# Patient Record
Sex: Female | Born: 1975 | Race: White | Hispanic: No | Marital: Single | Smoking: Former smoker
Health system: Southern US, Community
[De-identification: ages and names within clinical notes are randomized; demographics above are authoritative.]

## PROBLEM LIST (undated history)

## (undated) ENCOUNTER — Inpatient Hospital Stay (HOSPITAL_COMMUNITY): Payer: Self-pay

## (undated) DIAGNOSIS — N200 Calculus of kidney: Secondary | ICD-10-CM

## (undated) DIAGNOSIS — F329 Major depressive disorder, single episode, unspecified: Secondary | ICD-10-CM

## (undated) DIAGNOSIS — F32A Depression, unspecified: Secondary | ICD-10-CM

## (undated) DIAGNOSIS — E079 Disorder of thyroid, unspecified: Secondary | ICD-10-CM

## (undated) DIAGNOSIS — F419 Anxiety disorder, unspecified: Secondary | ICD-10-CM

## (undated) HISTORY — DX: Disorder of thyroid, unspecified: E07.9

## (undated) HISTORY — PX: WISDOM TOOTH EXTRACTION: SHX21

## (undated) HISTORY — PX: FEMUR FRACTURE SURGERY: SHX633

---

## 2002-12-05 ENCOUNTER — Inpatient Hospital Stay (HOSPITAL_COMMUNITY): Admission: AD | Admit: 2002-12-05 | Discharge: 2002-12-05 | Payer: Self-pay | Admitting: Obstetrics and Gynecology

## 2002-12-05 ENCOUNTER — Encounter: Payer: Self-pay | Admitting: Obstetrics and Gynecology

## 2002-12-11 ENCOUNTER — Inpatient Hospital Stay (HOSPITAL_COMMUNITY): Admission: RE | Admit: 2002-12-11 | Discharge: 2002-12-11 | Payer: Self-pay | Admitting: Obstetrics and Gynecology

## 2002-12-12 ENCOUNTER — Encounter (INDEPENDENT_AMBULATORY_CARE_PROVIDER_SITE_OTHER): Payer: Self-pay

## 2002-12-12 ENCOUNTER — Ambulatory Visit (HOSPITAL_COMMUNITY): Admission: RE | Admit: 2002-12-12 | Discharge: 2002-12-12 | Payer: Self-pay | Admitting: Obstetrics and Gynecology

## 2003-08-03 ENCOUNTER — Inpatient Hospital Stay (HOSPITAL_COMMUNITY): Admission: AD | Admit: 2003-08-03 | Discharge: 2003-08-03 | Payer: Self-pay | Admitting: Obstetrics

## 2003-08-05 ENCOUNTER — Inpatient Hospital Stay (HOSPITAL_COMMUNITY): Admission: AD | Admit: 2003-08-05 | Discharge: 2003-08-07 | Payer: Self-pay | Admitting: Obstetrics & Gynecology

## 2003-08-29 ENCOUNTER — Ambulatory Visit (HOSPITAL_COMMUNITY): Admission: RE | Admit: 2003-08-29 | Discharge: 2003-08-29 | Payer: Self-pay | Admitting: Obstetrics and Gynecology

## 2003-09-29 ENCOUNTER — Inpatient Hospital Stay (HOSPITAL_COMMUNITY): Admission: AD | Admit: 2003-09-29 | Discharge: 2003-09-29 | Payer: Self-pay | Admitting: Obstetrics and Gynecology

## 2003-10-27 ENCOUNTER — Inpatient Hospital Stay (HOSPITAL_COMMUNITY): Admission: AD | Admit: 2003-10-27 | Discharge: 2003-10-29 | Payer: Self-pay | Admitting: Obstetrics & Gynecology

## 2003-12-20 ENCOUNTER — Other Ambulatory Visit: Admission: RE | Admit: 2003-12-20 | Discharge: 2003-12-20 | Payer: Self-pay | Admitting: Obstetrics and Gynecology

## 2004-02-24 ENCOUNTER — Emergency Department (HOSPITAL_COMMUNITY): Admission: EM | Admit: 2004-02-24 | Discharge: 2004-02-24 | Payer: Self-pay | Admitting: Emergency Medicine

## 2004-08-08 IMAGING — US US ABDOMEN COMPLETE
1 series · 13 of 25 positions shown · non-contrast
Comparison: none

CLINICAL DATA: Reevaluate abdominal pain.  28 weeks estimated gestational age.  
ABDOMINAL ULTRASOUND:
Correlation is made with the previous CT performed on 08/06/03.  
Multiple images of the abdomen were obtained.  
The liver parenchyma appears homogeneous in echotexture without evidence for focal parenchymal abnormality or intrahepatic ductal dilatation.  The gallbladder demonstrates layering intraluminal sludge.  No pericholecystic fluid is seen and the gallbladder shows no evidence for thickness.  The common bile duct measures 2.9 mm in maximal sagittal length and this is within normal limits for size.  
The pancreas was seen in its entirety and is normal in size and echotexture.  
The right kidney demonstrates a maximal sagittal length of 12.8 cm and a small right lower pole renal calculus is identified correlating with the prior CT finding.  The left kidney has a maximal sagittal length of 12.7 cm and also contains a small left upper pole renal calculus which would correlate with the finding on the patient?s recent CT.  No hydronephrosis or ureterectasis is seen.  
The spleen has a normal ultrasound appearance.  The proximal portion of the inferior vena cava and visualized portion of the aorta down to the bifurcation is unremarkable.
IMPRESSION
Bilateral renal calculi correlating with the previous CT appearance.  Layering gallbladder sludge.  Otherwise, normal appearnce.

[Series 1: unknown · 0.32mm/px · 13 of 69 slices shown]
[im 1/69]
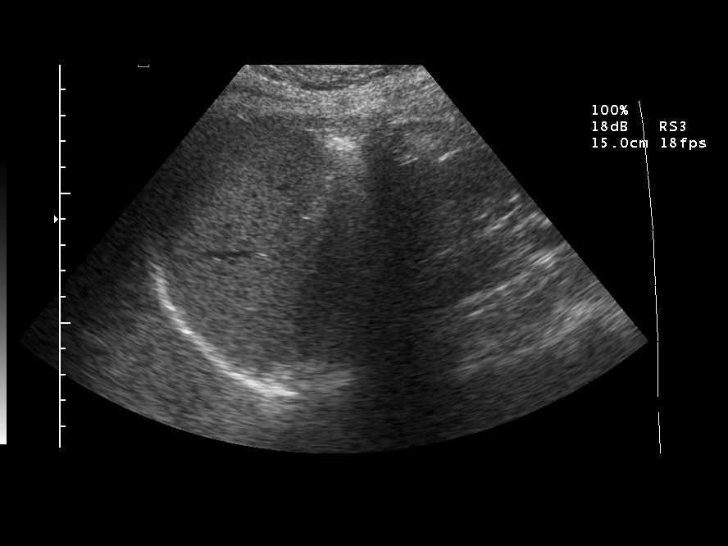
[im 6/69]
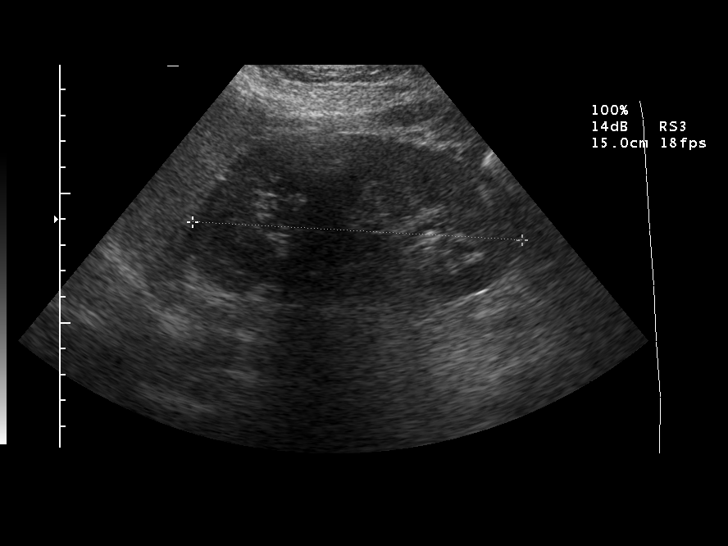
[im 12/69]
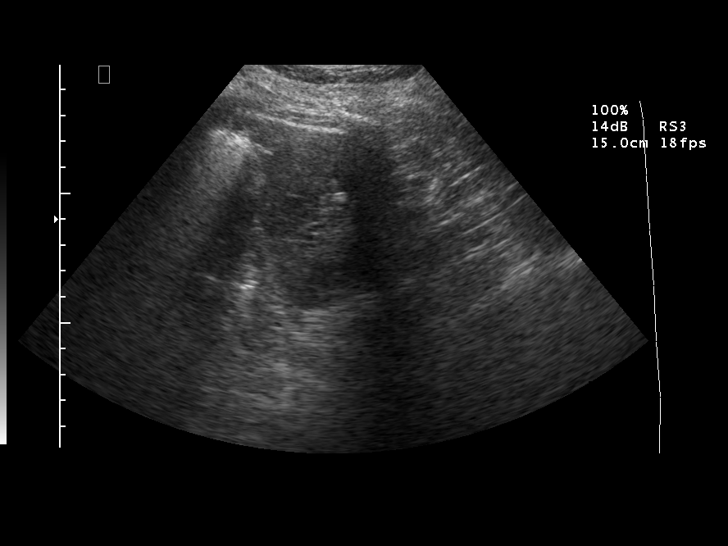
[im 18/69]
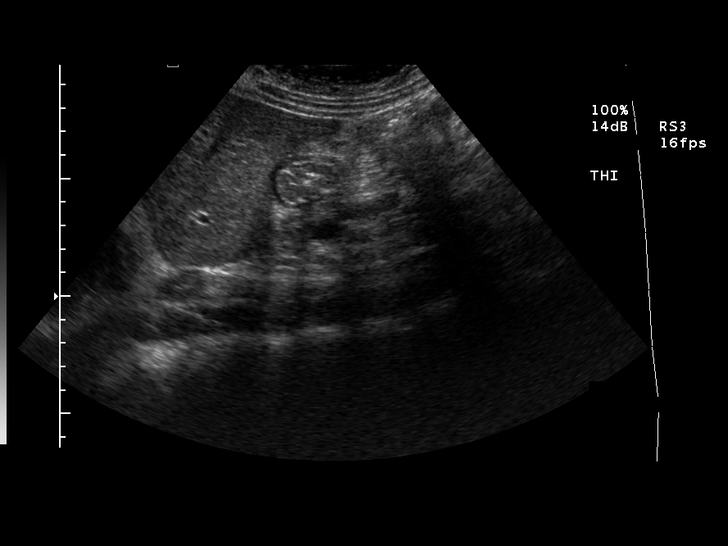
[im 23/69]
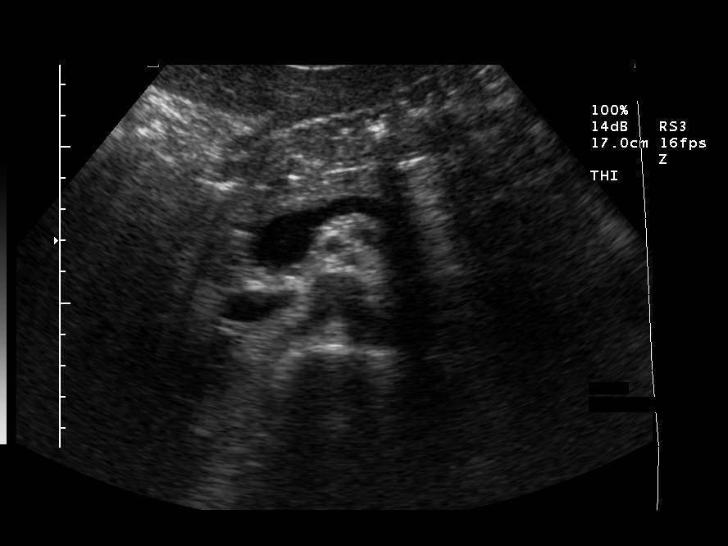
[im 29/69]
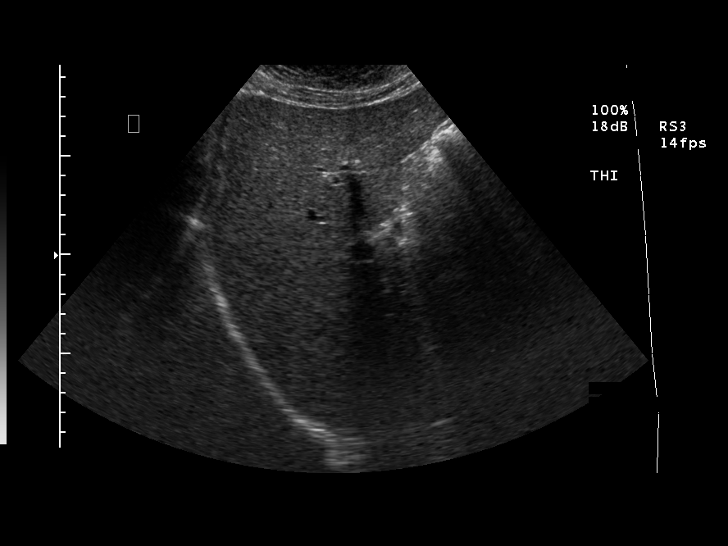
[im 35/69]
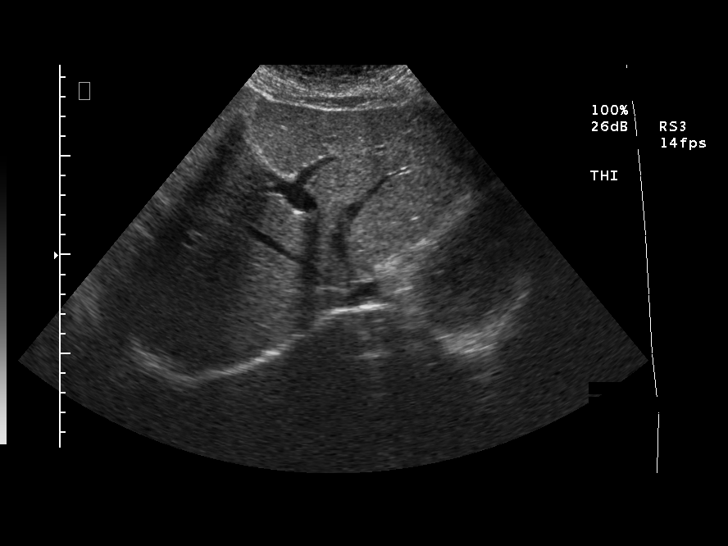
[im 40/69]
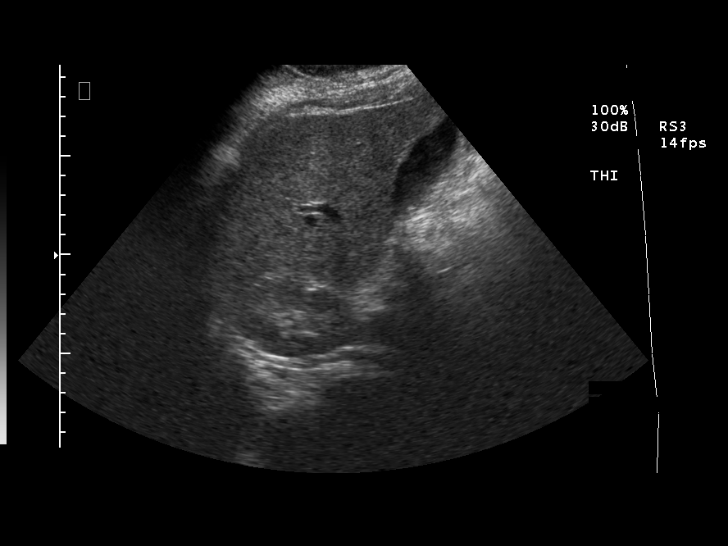
[im 46/69]
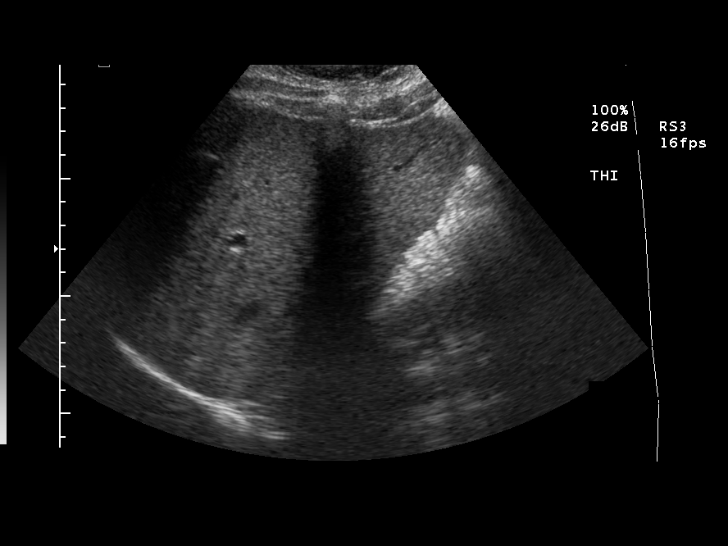
[im 52/69]
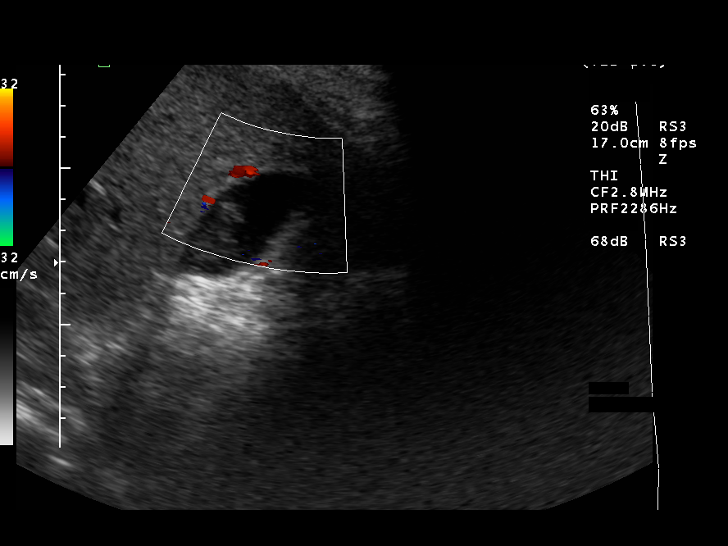
[im 57/69]
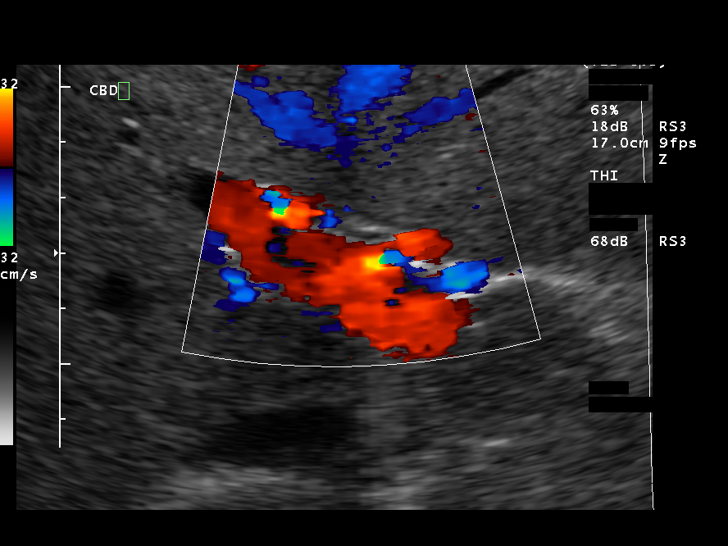
[im 63/69]
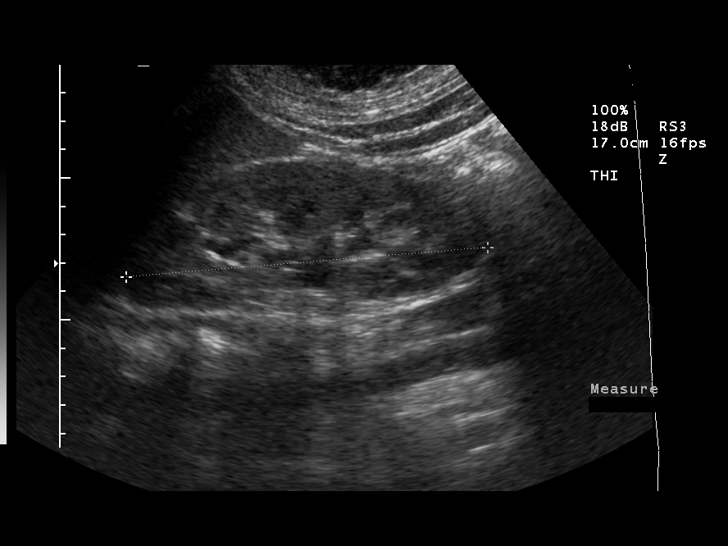
[im 69/69]
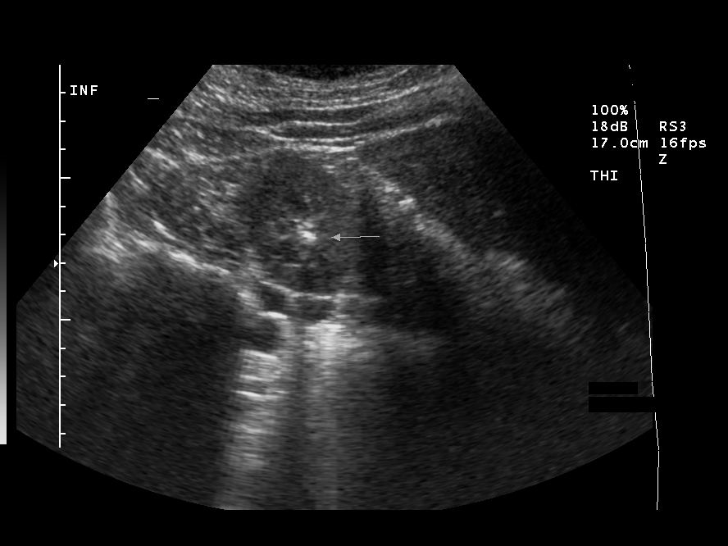

[13 of 25 positions shown; findings below may reference images not displayed]

## 2005-01-02 ENCOUNTER — Inpatient Hospital Stay (HOSPITAL_COMMUNITY): Admission: AC | Admit: 2005-01-02 | Discharge: 2005-01-18 | Payer: Self-pay

## 2005-01-02 ENCOUNTER — Ambulatory Visit: Payer: Self-pay | Admitting: Physical Medicine & Rehabilitation

## 2005-03-23 ENCOUNTER — Encounter: Admission: RE | Admit: 2005-03-23 | Discharge: 2005-04-23 | Payer: Self-pay | Admitting: Orthopedic Surgery

## 2006-10-06 ENCOUNTER — Inpatient Hospital Stay (HOSPITAL_COMMUNITY): Admission: AD | Admit: 2006-10-06 | Discharge: 2006-10-06 | Payer: Self-pay | Admitting: Obstetrics and Gynecology

## 2007-04-06 ENCOUNTER — Emergency Department (HOSPITAL_COMMUNITY): Admission: EM | Admit: 2007-04-06 | Discharge: 2007-04-06 | Payer: Self-pay | Admitting: Family Medicine

## 2007-04-24 ENCOUNTER — Emergency Department (HOSPITAL_COMMUNITY): Admission: EM | Admit: 2007-04-24 | Discharge: 2007-04-24 | Payer: Self-pay | Admitting: Family Medicine

## 2007-06-10 ENCOUNTER — Emergency Department (HOSPITAL_COMMUNITY): Admission: EM | Admit: 2007-06-10 | Discharge: 2007-06-10 | Payer: Self-pay | Admitting: Family Medicine

## 2007-10-16 ENCOUNTER — Ambulatory Visit: Payer: Self-pay | Admitting: Family Medicine

## 2007-11-09 ENCOUNTER — Ambulatory Visit: Payer: Self-pay | Admitting: Internal Medicine

## 2007-11-22 ENCOUNTER — Ambulatory Visit (HOSPITAL_COMMUNITY): Admission: RE | Admit: 2007-11-22 | Discharge: 2007-11-22 | Payer: Self-pay | Admitting: Family Medicine

## 2007-11-24 ENCOUNTER — Ambulatory Visit: Payer: Self-pay | Admitting: Internal Medicine

## 2008-03-23 ENCOUNTER — Emergency Department (HOSPITAL_COMMUNITY): Admission: EM | Admit: 2008-03-23 | Discharge: 2008-03-23 | Payer: Self-pay | Admitting: Emergency Medicine

## 2010-09-11 NOTE — Discharge Summary (Signed)
NAME:  Tammy Green, Tammy Green                         ACCOUNT NO.:  [id number]   MEDICAL RECORD NO.:  [id number]                   PATIENT TYPE:  INP   LOCATION:  9125                                 FACILITY:  [facility]   PHYSICIAN:  Zenaida Niece, M.D.             DATE OF BIRTH:  [birth year]   DATE OF ADMISSION:  10/27/2003  DATE OF DISCHARGE:                                 DISCHARGE SUMMARY   ADMISSION DIAGNOSES:  1. Intrauterine pregnancy at 38 weeks.  2. Anxiety.   DISCHARGE DIAGNOSES:  1. Intrauterine pregnancy at 38 weeks.  2. Anxiety.   PROCEDURE:  On October 27, 2003 she had a precipitous vaginal delivery.   HISTORY AND PHYSICAL:  This is a 35 year old white female gravida 5 para 1-0-  3-1 with an EGA of 38+ weeks by an LMP consistent with an 18-week ultrasound  with a due date of July 13 who presented with a complaint of regular  contractions with some spotting, no rupture of membranes, and good fetal  movement.  Initial evaluation in triage revealed her to have irregular  contractions and no change in her cervix from the exam in the office.  As  she was getting up to go home she had spontaneous rupture of membranes at  1310 with clear fluid.  Prenatal labs:  Blood type A positive with a  negative antibody screen, RPR nonreactive, rubella immune, hepatitis B  surface antigen negative, HIV negative, triple screen is normal, 1-hour  Glucola 138, group B strep negative.  Prenatal care:  Abdominal pain in  April with a negative workup and a follow-up ultrasound also with no  pathology and her pain did gradually resolve.  She had anxiety and was  started on Celexa at 28 weeks.  OB history:  One elective termination and  two spontaneous abortions and in 2002 a vaginal delivery at 38 weeks, 7  pounds 14 ounces, without complications.  Past medical history:  Anxiety.  Allergies:  AXID.  Medications:  Celexa 20 mg p.o. daily.  Physical exam:  She was afebrile with stable vital signs.  Fetal  heart tracing was reactive  with regular contractions.  Abdomen was gravid, nontender, with a fundal  height of 36 cm.  Cervix per the nurse on admission was 3 cm dilated.   HOSPITAL COURSE:  The patient was admitted and initially walked and then  received IV pain medicine.  She then received an epidural.  The patient  apparently was reluctant to have exams.  Finally, at approximately 1905 the  nurse was able to check her and she was 7 cm dilated.  I was called to come  for delivery at that time.  I was actually at that time getting in my car to  come in anyway.  The patient then began to push uncontrollably, progressed  rapidly to complete, and had a precipitous vaginal delivery.  This was a  viable female  infant with Apgars of 8 and 9 that weighed 6 pounds 1 ounce.  Placenta delivered spontaneous and was intact.  She had a small second  degree laceration repaired with 3-0 Vicryl with a local block.  Estimated  blood loss was approximately 500 mL.  Postpartum the patient had no  significant complications.  She bottle fed her baby without complications  and on the morning of postpartum day #2 was stable for discharge home.   DISCHARGE INSTRUCTIONS:  1. Regular diet, pelvic rest, follow up in 6 weeks.  2. Medications are Percocet #20 one to two p.o. q.4-6h. p.r.n. pain and over-     the-counter ibuprofen as needed.  3. She is given our discharge pamphlet.                                               Zenaida Niece, M.D.    TDM/MEDQ  D:  10/29/2003  T:  10/29/2003  Job:  [id number]
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
## 2010-09-11 NOTE — Op Note (Signed)
   Tammy Green, Tammy Green                         ACCOUNT NO.:  [id number]   MEDICAL RECORD NO.:  [id number]                   PATIENT TYPE:  AMB   LOCATION:  SDC                                  FACILITY:  [facility]   PHYSICIAN:  Phil D. Okey Dupre, M.D.                  DATE OF BIRTH:  12/21/1975   DATE OF PROCEDURE:  12/12/2002  DATE OF DISCHARGE:                                 OPERATIVE REPORT   PREOPERATIVE DIAGNOSIS:  Missed abortion.   POSTOPERATIVE DIAGNOSIS:  Missed abortion.   PROCEDURE:  Dilatation and evacuation.   SURGEON:  Javier Glazier. Okey Dupre, M.D.   ANESTHESIA:  MAC plus local.   ESTIMATED BLOOD LOSS:  Less than 5 mL.   OPERATIVE FINDINGS:  Uterus slightly enlarged and retroversion first degree  with a cervix that easily admitted a fingertip.   DESCRIPTION OF PROCEDURE:  A weighted speculum was placed in the posterior  fourchette of the vagina, the anterior lip of the cervix grasped with a  single-tooth tenaculum.  BUS was within normal limits.  Vagina was clean and  well-rugated.  The external genitalia was normal and the cervix was already  dilated to a size of a #10 Hegar dilator.  A #10 suction curette was  inserted to the fundus of the uterus and the uterine contents were evacuated  without incident.  There was no bleeding noted after the procedure from  either the cervix or the area where the tenaculum mark was.  The tenaculum  and speculum having been removed from the vagina, the patient was  transferred to the recovery room in satisfactory condition.  The uterine  curettings were sent for pathologic diagnosis as products of conception.                                               Phil D. Okey Dupre, M.D.    PDR/MEDQ  D:  12/12/2002  T:  12/13/2002  Job:  [id number]

## 2010-09-11 NOTE — Op Note (Signed)
NAMEFREDNA, STRICKER               ACCOUNT NO.:  [id number]   MEDICAL RECORD NO.:  [id number]          PATIENT TYPE:  INP   LOCATION:  2313                         FACILITY:  [facility]   PHYSICIAN:  Elana Alm. Thurston Hole, M.D. DATE OF BIRTH:  [birth year]   DATE OF PROCEDURE:  01/03/2005  DATE OF DISCHARGE:                                 OPERATIVE REPORT   PREOPERATIVE DIAGNOSIS:  Right femoral shaft fracture.   POSTOPERATIVE DIAGNOSIS:  Right femoral shaft fracture.   PROCEDURE:  Closed reduction and intramedullary rodding, right femoral shaft  fracture; using cement Nephew retrograde rod, with a 10 x 34 size -- with 2  distal interlocking screws and 1 proximal interlocking screw.   SURGEON:  Elana Alm. Thurston Hole, M.D.   ASSISTANT:  Julien Girt, P.A.   ANESTHESIA:  General.   OPERATIVE TIME:  1 hour.   ESTIMATED BLOOD LOSS:  50 cc.   COMPLICATIONS:  None.   DESCRIPTION OF PROCEDURE:  Ms. Springer was brought to the operating room on  January 03, 2005; placed under general anesthesia on her own bed and then  transferred to the operative table. She had a Foley catheter placed under  sterile conditions. She received Ancef 1 g IV preoperatively for  prophylaxis. The right leg was then prepped using sterile DuraPrep and  draped using sterile technique. Initial exposure for the placement of the  rod was done through a 4 cm patellar tendon incision. The underlying  subcutaneous tissues were incised in line with the skin incision. The  patellar tendon was split longitudinally, and then using a guide pin and  fluoroscopic control, a guide pin was placed just anterior to the  intercondylar notch, and driven up into the distal femoral shaft under AP  and lateral fluoroscopic guidance.  Then overdrilled with a 12.5 mm drill.  After this was done and a guide pin was placed through this hole, up across  the fracture and into the proximal femoral shaft under fluoroscopic control,  it was  measured for length and found to be 34 mm in length. It was then  overdrilled to 11 mm. After this was done then the 10 mm x 34 mm rod was  placed over the guidewire and hammered into position, holding the fracture  in a reduced and anatomic position. Two distal interlocking screws were then  placed, using fluoroscopic guidance and the distal aiming device.  One of  these was 80 mm, one was 70 through small lateral incisions. This was  confirmed with fluoroscopy. The proximal interlocking screw was then placed  through a 3 cm anterior longitudinal incision under fluoroscopic control,  and a proximal 30 mm screw was placed. This completely locked rotationally  the fracture in a reduced and anatomic position. AP and lateral fluoroscopic  x-rays were obtained, and confirmed satisfactory position of the fracture  and satisfactory position of the hardware. The wounds were then irrigated.  The patellar tendon incision was closed with 2-0 Vicryl; subcutaneous  tissues closed with 2-0 Vicryl, skin closed with 4-  0 Monocryl, as well as the proximal interlocking screw incision. Sterile  dressings were applied.  Neurovascular status checked; pulses 2+ and  symmetric. The patient then awakened, extubated and taken to the recovery  room in stable condition. Needle and sponge count was correct x2 at the end  of the case.      Robert A. Thurston Hole, M.D.  Electronically Signed     RAW/MEDQ  D:  01/03/2005  T:  01/03/2005  Job:  [id number]

## 2011-01-03 ENCOUNTER — Emergency Department (HOSPITAL_COMMUNITY): Payer: Self-pay

## 2011-01-03 ENCOUNTER — Emergency Department (HOSPITAL_COMMUNITY)
Admission: EM | Admit: 2011-01-03 | Discharge: 2011-01-03 | Disposition: A | Discharge status: Home / Self Care | Payer: Self-pay | Attending: Emergency Medicine | Admitting: Emergency Medicine

## 2011-01-03 DIAGNOSIS — K047 Periapical abscess without sinus: Secondary | ICD-10-CM | POA: Insufficient documentation

## 2011-01-03 DIAGNOSIS — K029 Dental caries, unspecified: Secondary | ICD-10-CM | POA: Insufficient documentation

## 2011-01-03 DIAGNOSIS — K089 Disorder of teeth and supporting structures, unspecified: Secondary | ICD-10-CM | POA: Insufficient documentation

## 2011-01-03 MED ORDER — IOHEXOL 300 MG/ML  SOLN
75.0000 mL | Freq: Once | INTRAMUSCULAR | Status: AC | PRN
  Administered 2011-01-03: 75 mL via INTRAVENOUS

## 2011-02-11 LAB — URINALYSIS, ROUTINE W REFLEX MICROSCOPIC
Glucose, UA: NEGATIVE
Nitrite: NEGATIVE
pH: 6

## 2011-02-11 LAB — POCT PREGNANCY, URINE
Operator id: 235481
Preg Test, Ur: NEGATIVE

## 2011-02-11 LAB — CBC
Hemoglobin: 15.1 — ABNORMAL HIGH
RBC: 4.94
RDW: 12.6

## 2011-02-11 LAB — URINE MICROSCOPIC-ADD ON

## 2011-04-27 DIAGNOSIS — N200 Calculus of kidney: Secondary | ICD-10-CM

## 2011-04-27 HISTORY — DX: Calculus of kidney: N20.0

## 2011-11-02 ENCOUNTER — Inpatient Hospital Stay (HOSPITAL_COMMUNITY)
Admission: AD | Admit: 2011-11-02 | Discharge: 2011-11-02 | Disposition: A | Discharge status: Home / Self Care | Payer: Medicaid Other | Source: Ambulatory Visit | Attending: Obstetrics & Gynecology | Admitting: Obstetrics & Gynecology

## 2011-11-02 ENCOUNTER — Inpatient Hospital Stay (HOSPITAL_COMMUNITY): Payer: Medicaid Other

## 2011-11-02 ENCOUNTER — Encounter (HOSPITAL_COMMUNITY): Payer: Self-pay | Admitting: *Deleted

## 2011-11-02 DIAGNOSIS — O209 Hemorrhage in early pregnancy, unspecified: Secondary | ICD-10-CM

## 2011-11-02 DIAGNOSIS — A499 Bacterial infection, unspecified: Secondary | ICD-10-CM | POA: Insufficient documentation

## 2011-11-02 DIAGNOSIS — O98819 Other maternal infectious and parasitic diseases complicating pregnancy, unspecified trimester: Secondary | ICD-10-CM | POA: Insufficient documentation

## 2011-11-02 DIAGNOSIS — O210 Mild hyperemesis gravidarum: Secondary | ICD-10-CM | POA: Insufficient documentation

## 2011-11-02 DIAGNOSIS — R109 Unspecified abdominal pain: Secondary | ICD-10-CM | POA: Insufficient documentation

## 2011-11-02 DIAGNOSIS — A5901 Trichomonal vulvovaginitis: Secondary | ICD-10-CM | POA: Insufficient documentation

## 2011-11-02 DIAGNOSIS — N760 Acute vaginitis: Secondary | ICD-10-CM | POA: Insufficient documentation

## 2011-11-02 DIAGNOSIS — B9689 Other specified bacterial agents as the cause of diseases classified elsewhere: Secondary | ICD-10-CM | POA: Insufficient documentation

## 2011-11-02 DIAGNOSIS — A599 Trichomoniasis, unspecified: Secondary | ICD-10-CM

## 2011-11-02 DIAGNOSIS — O219 Vomiting of pregnancy, unspecified: Secondary | ICD-10-CM

## 2011-11-02 DIAGNOSIS — O2390 Unspecified genitourinary tract infection in pregnancy, unspecified trimester: Secondary | ICD-10-CM | POA: Insufficient documentation

## 2011-11-02 HISTORY — DX: Anxiety disorder, unspecified: F41.9

## 2011-11-02 HISTORY — DX: Depression, unspecified: F32.A

## 2011-11-02 HISTORY — DX: Major depressive disorder, single episode, unspecified: F32.9

## 2011-11-02 LAB — CBC
HCT: 41.9 % (ref 36.0–46.0)
MCH: 30.8 pg (ref 26.0–34.0)
MCHC: 33.4 g/dL (ref 30.0–36.0)
MCV: 92.3 fL (ref 78.0–100.0)
WBC: 11.7 10*3/uL — ABNORMAL HIGH (ref 4.0–10.5)

## 2011-11-02 LAB — URINE MICROSCOPIC-ADD ON

## 2011-11-02 LAB — URINALYSIS, ROUTINE W REFLEX MICROSCOPIC
Ketones, ur: NEGATIVE mg/dL
pH: 8 (ref 5.0–8.0)

## 2011-11-02 LAB — HCG, QUANTITATIVE, PREGNANCY: hCG, Beta Chain, Quant, S: 53820 m[IU]/mL — ABNORMAL HIGH (ref <5)

## 2011-11-02 LAB — WET PREP, GENITAL: Yeast Wet Prep HPF POC: NONE SEEN

## 2011-11-02 LAB — POCT PREGNANCY, URINE: Preg Test, Ur: POSITIVE — AB

## 2011-11-02 MED ORDER — METRONIDAZOLE 500 MG PO TABS: 500.0000 mg | ORAL_TABLET | Freq: Two times a day (BID) | ORAL | Status: AC

## 2011-11-02 MED ORDER — PROMETHAZINE HCL 12.5 MG PO TABS: 12.5000 mg | ORAL_TABLET | Freq: Four times a day (QID) | ORAL | Status: DC | PRN

## 2011-11-02 NOTE — MAU Provider Note (Signed)
History  CSN: [id number] Arrival date and time: 11/02/11 1209  First Provider Initiated Contact with Patient 11/02/11 1259    Chief Complaint  Patient presents with  . Possible Pregnancy  . Abdominal Cramping   HPI Patient is a tearful 36 yo woman, [id number], @ unknown with LMP approximately last week of May with PMH of 7 miscarriages and 2 elective abortions. Patient presents with vaginal discharge, nausea, and abdominal tenderness. Patient reports that 2 weeks ago she had a positive pregnancy test and then approx. 1 week ago (10/27/11) she began having red spotting that progressed to red bleeding into the toilet mixed with white tissue-like substances. This continued for approximately 24 hours and began to change over to pink discharge by 10/28/11 which continued to saturate her liner which was changed about every 2-3 hours. This morning as the patient awoke she noticed the discharge had changed to yellow and she was worried it might be a sign of infection. Patient has also had nausea since Sunday and has not been able to eat any food or keep anything down. She has been dry heaving and decided to eat something last night just so she would be able to vomit something. Patient also reports having nightly chills, cold sweats, abdominal cramping all of which she is used to experiencing when having a miscarriage.   OB History    Grav Para Term Preterm Abortions TAB SAB Ect Mult Living   12 2 2  9 2 7   2       Past Medical History  Diagnosis Date  . Anxiety   . Depression     Past Surgical History  Procedure Date  . Femur fracture surgery   . Wisdom tooth extraction     Family History  Problem Relation Age of Onset  . Bipolar disorder Mother   . Bipolar disorder Sister   . Bipolar disorder Brother   . Diabetes Maternal Grandmother   . Cancer Maternal Grandfather     History  Substance Use Topics  . Smoking status: Current Everyday Smoker -- 0.2 packs/day for 18 years  . Smokeless  tobacco: Not on file  . Alcohol Use: No    Allergies:  Allergies  Allergen Reactions  . Axid (Nizatidine) Hives    Prescriptions prior to admission  Medication Sig Dispense Refill  . naproxen sodium (ANAPROX) 220 MG tablet Take 440 mg by mouth 2 (two) times daily as needed. For pain        Review of Systems  Constitutional: Positive for chills (qhs), malaise/fatigue and diaphoresis ( cold sweats). Negative for fever.  HENT: Negative.   Eyes: Negative.   Respiratory: Negative.   Cardiovascular: Negative.   Gastrointestinal: Positive for nausea, vomiting and abdominal pain ( LLQ / LRQ (discomfort)).  Genitourinary: Negative.   Musculoskeletal: Negative.   Skin: Negative.   Neurological: Negative.   Endo/Heme/Allergies: Negative.   Psychiatric/Behavioral: Positive for depression. Negative for suicidal ideas and substance abuse. The patient is nervous/anxious.    Physical Exam   Blood pressure 107/59, pulse 67, temperature 98.5 F (36.9 C), temperature source Oral, resp. rate 16, height 5' 6.5" (1.689 m), weight 83.099 kg (183 lb 3.2 oz), last menstrual period 09/20/2011, SpO2 100.00%.  Physical Exam  Constitutional: She is oriented to person, place, and time. She appears well-developed and well-nourished.       Upset, tearful, sad  HENT:  Head: Normocephalic and atraumatic.  Eyes: Conjunctivae are normal.  Neck: Normal range of motion. Neck supple.  Cardiovascular:  Normal rate, regular rhythm and normal heart sounds.   Respiratory: Effort normal and breath sounds normal.  GI: Soft. There is tenderness ( LLQ / RLQ).  Genitourinary: Uterus is enlarged (mildly enlarged) and tender (mild). Cervix exhibits motion tenderness. Cervix exhibits no discharge and no friability. Right adnexum displays no mass, no tenderness and no fullness. Left adnexum displays tenderness. Left adnexum displays no mass and no fullness. No bleeding around the vagina. Vaginal discharge (light yellowish  vaginal discharge with mild odor) found.  Musculoskeletal: She exhibits no edema and no tenderness.  Neurological: She is alert and oriented to person, place, and time.  Skin: Skin is warm and dry.  Psychiatric: She has a normal mood and affect. Her behavior is normal.   Results for orders placed during the hospital encounter of 11/02/11 (from the past 24 hour(s))  URINALYSIS, ROUTINE W REFLEX MICROSCOPIC     Status: Abnormal   Collection Time   11/02/11 12:25 PM      Component Value Range   Color, Urine YELLOW  YELLOW   APPearance CLOUDY (*) CLEAR   Specific Gravity, Urine 1.015  1.005 - 1.030   pH 8.0  5.0 - 8.0   Glucose, UA NEGATIVE  NEGATIVE mg/dL   Hgb urine dipstick NEGATIVE  NEGATIVE   Bilirubin Urine NEGATIVE  NEGATIVE   Ketones, ur NEGATIVE  NEGATIVE mg/dL   Protein, ur NEGATIVE  NEGATIVE mg/dL   Urobilinogen, UA 0.2  0.0 - 1.0 mg/dL   Nitrite NEGATIVE  NEGATIVE   Leukocytes, UA MODERATE (*) NEGATIVE  URINE MICROSCOPIC-ADD ON     Status: Abnormal   Collection Time   11/02/11 12:25 PM      Component Value Range   Squamous Epithelial / LPF MANY (*) RARE   WBC, UA 7-10  <3 WBC/hpf   Bacteria, UA FEW (*) RARE   Urine-Other AMORPHOUS URATES/PHOSPHATES    POCT PREGNANCY, URINE     Status: Abnormal   Collection Time   11/02/11 12:37 PM      Component Value Range   Preg Test, Ur POSITIVE (*) NEGATIVE  CBC     Status: Abnormal   Collection Time   11/02/11  1:58 PM      Component Value Range   WBC 11.7 (*) 4.0 - 10.5 K/uL   RBC 4.54  3.87 - 5.11 MIL/uL   Hemoglobin 14.0  12.0 - 15.0 g/dL   HCT 45.4  09.8 - 11.9 %   MCV 92.3  78.0 - 100.0 fL   MCH 30.8  26.0 - 34.0 pg   MCHC 33.4  30.0 - 36.0 g/dL   RDW 14.7  82.9 - 56.2 %   Platelets 254  150 - 400 K/uL  ABO/RH     Status: Normal   Collection Time   11/02/11  1:58 PM      Component Value Range   ABO/RH(D) A POS    HCG, QUANTITATIVE, PREGNANCY     Status: Abnormal   Collection Time   11/02/11  1:58 PM      Component Value  Range   hCG, Beta Chain, Quant, S 53820 (*) <5 mIU/mL  WET PREP, GENITAL     Status: Abnormal   Collection Time   11/02/11  2:00 PM      Component Value Range   Yeast Wet Prep HPF POC NONE SEEN  NONE SEEN   Trich, Wet Prep MODERATE (*) NONE SEEN   Clue Cells Wet Prep HPF POC MANY (*) NONE SEEN  WBC, Wet Prep HPF POC TOO NUMEROUS TO COUNT (*) NONE SEEN      MAU Course  Procedures - [place] to determine state of pregnancy, wet prep to r/o infection, GC/CHL to lab   MDM - US pelvis to determine IUP vs. ectopic vs. Miscarriage - Wet prep and GC/CHL to r/o infection given extreme vaginal introitus tenderness  I reviewed the student's H&P, observed his exam and reviewed the note and plan of care and agree with his findings.  Assessment and Plan  1) Viable IUP @ [redacted]w[redacted]d gestation 2) Trich and BV - flagyl rx for 7 days 3) N & V of pregnancy - will give rx phenergan 4) Begin prenatal care with Dr. Jackelyn Knife (who delivered her last baby)  Lewie Chamber 11/02/2011, 1:19 PM

## 2011-11-02 NOTE — MAU Provider Note (Signed)
Medical Screening exam and patient care preformed by advanced practice provider.  Agree with the above management.  

## 2011-11-02 NOTE — MAU Note (Signed)
Patient states she had 2 positive home pregnancy tests about 2 weeks ago. Had some spotting last week and has been having abdominal cramping with a yellow discharge with an odor. Cold sweats, nausea with one episode of vomiting.

## 2011-11-02 NOTE — MAU Note (Signed)
Had 2 +hpt, has had cramping and spotting with discharge for the past week with an odor that she says is not normal for her.

## 2011-11-03 LAB — GC/CHLAMYDIA PROBE AMP, GENITAL: GC Probe Amp, Genital: NEGATIVE

## 2011-11-19 ENCOUNTER — Encounter (HOSPITAL_COMMUNITY): Payer: Self-pay | Admitting: *Deleted

## 2011-11-21 ENCOUNTER — Inpatient Hospital Stay (HOSPITAL_COMMUNITY)
Admission: AD | Admit: 2011-11-21 | Discharge: 2011-11-21 | Disposition: A | Discharge status: Home / Self Care | Payer: Medicaid Other | Source: Ambulatory Visit | Attending: Obstetrics and Gynecology | Admitting: Obstetrics and Gynecology

## 2011-11-21 ENCOUNTER — Encounter (HOSPITAL_COMMUNITY): Payer: Self-pay

## 2011-11-21 DIAGNOSIS — O209 Hemorrhage in early pregnancy, unspecified: Secondary | ICD-10-CM | POA: Insufficient documentation

## 2011-11-21 DIAGNOSIS — O200 Threatened abortion: Secondary | ICD-10-CM

## 2011-11-21 NOTE — MAU Note (Signed)
Got home from work tonight and found brown blood in pants. Since then sees pink spotting on toilet paper when she wipes. Denies abdominal cramping or pain.

## 2011-11-21 NOTE — MAU Provider Note (Signed)
Agree with above note.  Tammy Green 11/21/2011 7:50 AM

## 2011-11-21 NOTE — MAU Provider Note (Signed)
  History     CSN: [id number]  Arrival date and time: 11/21/11 0158   None     Chief Complaint  Patient presents with  . Vaginal Bleeding   HPI Tammy Green is a 36 y.o. female @ [redacted]w[redacted]d gestation who presents to MAU for spotting. Patient states it is less than when she was here 11/02/11, but because she has had so many miscarriages every time she sees spotting she is afraid she is going to miscarry. She denies any other problems tonight. The history was provided by the patient.  OB History    Grav Para Term Preterm Abortions TAB SAB Ect Mult Living   12 2 2  9 2 7   2       Past Medical History  Diagnosis Date  . Anxiety   . Depression     Past Surgical History  Procedure Date  . Femur fracture surgery   . Wisdom tooth extraction     Family History  Problem Relation Age of Onset  . Bipolar disorder Mother   . Bipolar disorder Sister   . Bipolar disorder Brother   . Diabetes Maternal Grandmother   . Cancer Maternal Grandfather     History  Substance Use Topics  . Smoking status: Former Smoker -- 0.2 packs/day for 18 years  . Smokeless tobacco: Former Neurosurgeon    Quit date: 11/15/2011  . Alcohol Use: No    Allergies:  Allergies  Allergen Reactions  . Axid (Nizatidine) Hives    Prescriptions prior to admission  Medication Sig Dispense Refill  . metroNIDAZOLE (FLAGYL) 500 MG tablet Take 500 mg by mouth 2 (two) times daily.      . promethazine (PHENERGAN) 12.5 MG tablet Take 1 tablet (12.5 mg total) by mouth every 6 (six) hours as needed for nausea.  30 tablet  0    ROS: as stated in HPI Blood pressure 108/64, pulse 83, temperature 98.1 F (36.7 C), temperature source Oral, resp. rate 16, height 5' 6.5" (1.689 m), weight 185 lb 9.6 oz (84.188 kg), last menstrual period 09/20/2011.  Physical Exam  Nursing note and vitals reviewed. Constitutional: She is oriented to person, place, and time. She appears well-developed and well-nourished. No distress.  HENT:    Head: Normocephalic and atraumatic.  Eyes: EOM are normal.  Neck: Neck supple.  Cardiovascular: Normal rate.   Respiratory: Effort normal.  GI: Soft. There is no tenderness.  Genitourinary:       Scant dark blood vaginal vault. Cervix closed.  Musculoskeletal: Normal range of motion.  Neurological: She is alert and oriented to person, place, and time.  Skin: Skin is warm and dry.  Psychiatric: Her behavior is normal. Judgment and thought content normal. Her mood appears anxious.    MAU Course  Procedures Informal bedside ultrasound shows IUP with cardiac activity. Patient able to visualize heart beat.  Assessment and Plan  37 y.o. female @ [redacted]w[redacted]d gestation with vaginal bleeding  Discussed with the patient threatened AB and plan of care. Patient voices understanding Start prenatal care Return here as needed  NEESE,HOPE, RN, FNP, [facility] 11/21/2011, 2:57 AM

## 2012-02-05 ENCOUNTER — Encounter (HOSPITAL_COMMUNITY): Payer: Self-pay

## 2012-02-05 ENCOUNTER — Inpatient Hospital Stay (HOSPITAL_COMMUNITY)
Admission: AD | Admit: 2012-02-05 | Discharge: 2012-02-05 | Disposition: A | Discharge status: Home / Self Care | Payer: Medicaid Other | Source: Ambulatory Visit | Attending: Obstetrics and Gynecology | Admitting: Obstetrics and Gynecology

## 2012-02-05 DIAGNOSIS — R109 Unspecified abdominal pain: Secondary | ICD-10-CM | POA: Insufficient documentation

## 2012-02-05 DIAGNOSIS — O99891 Other specified diseases and conditions complicating pregnancy: Secondary | ICD-10-CM | POA: Insufficient documentation

## 2012-02-05 DIAGNOSIS — N949 Unspecified condition associated with female genital organs and menstrual cycle: Secondary | ICD-10-CM

## 2012-02-05 MED ORDER — CYCLOBENZAPRINE HCL 10 MG PO TABS: 10.0000 mg | ORAL_TABLET | Freq: Three times a day (TID) | ORAL | Status: DC | PRN

## 2012-02-05 NOTE — MAU Provider Note (Signed)
Chief Complaint: Abdominal Cramping  First Provider Initiated Contact with Patient 02/05/12 0221      SUBJECTIVE HPI: Tammy Green is a 36 y.o. [id number] at [redacted]w[redacted]d by LMP who presents with Patient states she has had cramping for several days worse today because has been lifting heavy things, cleaning house because of bed bugs, carrying son because he fractured his ankle. Pain is sharp, intermittent, worse w/ mvmt. Denies VB, LOF, urinary Sx, GI Sx.    Past Medical History  Diagnosis Date  . Anxiety   . Depression    OB History    Grav Para Term Preterm Abortions TAB SAB Ect Mult Living   12 2 2  9 2 7   2      # Outc Date GA Lbr Len/2nd Wgt Sex Del Anes PTL Lv   1 TRM      SVD   Yes   2 TRM      SVD   Yes   3 TAB            4 TAB            5 SAB            6 SAB            7 SAB            8 SAB            9 SAB            10 SAB            11 SAB            12 CUR              Past Surgical History  Procedure Date  . Femur fracture surgery   . Wisdom tooth extraction    History   Social History  . Marital Status: Single    Spouse Name: N/A    Number of Children: N/A  . Years of Education: N/A   Occupational History  . Not on file.   Social History Main Topics  . Smoking status: Former Smoker -- 0.2 packs/day for 18 years  . Smokeless tobacco: Former Neurosurgeon    Quit date: 11/15/2011  . Alcohol Use: No  . Drug Use: No  . Sexually Active: Not Currently     not since she conceived   Other Topics Concern  . Not on file   Social History Narrative  . No narrative on file   No current facility-administered medications on file prior to encounter.   Current Outpatient Prescriptions on File Prior to Encounter  Medication Sig Dispense Refill  . metroNIDAZOLE (FLAGYL) 500 MG tablet Take 500 mg by mouth 2 (two) times daily.      . promethazine (PHENERGAN) 12.5 MG tablet Take 1 tablet (12.5 mg total) by mouth every 6 (six) hours as needed for nausea.  30 tablet  0     Allergies  Allergen Reactions  . Axid (Nizatidine) Hives    ROS: Pertinent items in HPI  OBJECTIVE Blood pressure 101/54, pulse 76, temperature 97.5 F (36.4 C), temperature source Oral, resp. rate 18, height 5\' 7"  (1.702 m), weight 83.008 kg (183 lb), last menstrual period 09/20/2011. GENERAL: Well-developed, well-nourished female in no acute distress. Anxious.  HEENT: Normocephalic HEART: normal rate RESP: normal effort ABDOMEN: Soft, non-tender, Fundus @U  EXTREMITIES: Nontender, no edema NEURO: Alert and oriented SPECULUM EXAM: Deferred BIMANUAL: cervix long and closed;  no blood or LOF. FHR 154 by doppler  LAB RESULTS No results found for this or any previous visit (from the past 24 hour(s)).  IMAGING No results found.  MAU COURSE After exam pt expressed anxiety about findings on [place]. Pt not sure of terminology, but state dthe the baby has a "black spot " on its heart and the placenta is over the cervix. Asked pt if she has been to to be on pelvic rest. She denies that she was told to do so. Discussed that I do not have the [place] report available for review, but recommended no IC of pelvic exams until location of placenta verified with office. Pt verbalizes understanding. No bleeding after gentle exam.   ASSESSMENT 1. Round ligament pain    PLAN Discharge home Comfort measures Pelvic rest Bleeding precautions     Follow-up Information    Follow up with Bing Plume, MD. On 02/08/2012. (or MAU as needed if symptoms worsen)    Contact information:   [employer] [address], [address] [place] [address] [phone]           Medication List     As of 02/05/2012  3:32 AM    TAKE these medications         cyclobenzaprine 10 MG tablet   Commonly known as: FLEXERIL   Take 1 tablet (10 mg total) by mouth 3 (three) times daily as needed for muscle spasms.      metroNIDAZOLE 500 MG tablet   Commonly known as: FLAGYL    Take 500 mg by mouth 2 (two) times daily.      promethazine 12.5 MG tablet   Commonly known as: PHENERGAN   Take 1 tablet (12.5 mg total) by mouth every 6 (six) hours as needed for nausea.        [place], CNM 02/05/2012  3:32 AM

## 2012-02-05 NOTE — MAU Note (Signed)
Patient states she has had cramping for several days worse today because has been lifting heavy things, house had bed bugs, son fractured his ankle so she has been carrying him around.

## 2012-02-08 LAB — OB RESULTS CONSOLE ANTIBODY SCREEN: Antibody Screen: NEGATIVE

## 2012-02-08 LAB — OB RESULTS CONSOLE RUBELLA ANTIBODY, IGM: Rubella: IMMUNE

## 2012-02-08 LAB — OB RESULTS CONSOLE HEPATITIS B SURFACE ANTIGEN: Hepatitis B Surface Ag: NEGATIVE

## 2012-02-08 LAB — OB RESULTS CONSOLE RPR: RPR: NONREACTIVE

## 2012-03-27 ENCOUNTER — Observation Stay (HOSPITAL_COMMUNITY)
Admission: AD | Admit: 2012-03-27 | Discharge: 2012-03-30 | Disposition: A | Discharge status: Home / Self Care | Payer: Medicaid Other | Source: Ambulatory Visit | Attending: Obstetrics and Gynecology | Admitting: Obstetrics and Gynecology

## 2012-03-27 ENCOUNTER — Inpatient Hospital Stay (HOSPITAL_COMMUNITY): Payer: Medicaid Other

## 2012-03-27 ENCOUNTER — Encounter (HOSPITAL_COMMUNITY): Payer: Self-pay | Admitting: Family

## 2012-03-27 DIAGNOSIS — R109 Unspecified abdominal pain: Secondary | ICD-10-CM | POA: Insufficient documentation

## 2012-03-27 DIAGNOSIS — N1330 Unspecified hydronephrosis: Secondary | ICD-10-CM | POA: Insufficient documentation

## 2012-03-27 DIAGNOSIS — O26839 Pregnancy related renal disease, unspecified trimester: Principal | ICD-10-CM | POA: Insufficient documentation

## 2012-03-27 DIAGNOSIS — N200 Calculus of kidney: Secondary | ICD-10-CM | POA: Diagnosis present

## 2012-03-27 DIAGNOSIS — N201 Calculus of ureter: Secondary | ICD-10-CM | POA: Insufficient documentation

## 2012-03-27 LAB — URINALYSIS, ROUTINE W REFLEX MICROSCOPIC
Nitrite: NEGATIVE
Protein, ur: NEGATIVE mg/dL
Specific Gravity, Urine: 1.02 (ref 1.005–1.030)
Urobilinogen, UA: 0.2 mg/dL (ref 0.0–1.0)

## 2012-03-27 LAB — URINE MICROSCOPIC-ADD ON

## 2012-03-27 MED ORDER — HYDROMORPHONE 0.3 MG/ML IV SOLN
INTRAVENOUS | Status: DC
  Administered 2012-03-27: 23:00:00 via INTRAVENOUS
  Administered 2012-03-28: 1.79 mg via INTRAVENOUS
  Administered 2012-03-28: 0.999 mg via INTRAVENOUS
  Administered 2012-03-28: 2.19 mg via INTRAVENOUS
  Administered 2012-03-29: 1.79 mg via INTRAVENOUS
  Administered 2012-03-29: 0.679 mg via INTRAVENOUS
  Administered 2012-03-29: 03:00:00 via INTRAVENOUS
  Administered 2012-03-30: 0.799 mg via INTRAVENOUS
  Administered 2012-03-30 (×2): 0.2 mg via INTRAVENOUS
  Filled 2012-03-27 (×4): qty 25

## 2012-03-27 MED ORDER — CALCIUM CARBONATE ANTACID 500 MG PO CHEW: 2.0000 | CHEWABLE_TABLET | ORAL | Status: DC | PRN

## 2012-03-27 MED ORDER — PROMETHAZINE HCL 25 MG/ML IJ SOLN
12.5000 mg | Freq: Four times a day (QID) | INTRAMUSCULAR | Status: DC | PRN
  Administered 2012-03-27: 12.5 mg via INTRAVENOUS
  Filled 2012-03-27: qty 1

## 2012-03-27 MED ORDER — SODIUM CHLORIDE 0.9 % IJ SOLN: 9.0000 mL | INTRAMUSCULAR | Status: DC | PRN

## 2012-03-27 MED ORDER — LACTATED RINGERS IV SOLN
INTRAVENOUS | Status: DC
  Administered 2012-03-27: 20:00:00 via INTRAVENOUS
  Administered 2012-03-27: 250 mL/h via INTRAVENOUS

## 2012-03-27 MED ORDER — DIPHENHYDRAMINE HCL 12.5 MG/5ML PO ELIX
12.5000 mg | ORAL_SOLUTION | Freq: Four times a day (QID) | ORAL | Status: DC | PRN
  Administered 2012-03-28 – 2012-03-29 (×3): 12.5 mg via ORAL
  Filled 2012-03-27 (×3): qty 5

## 2012-03-27 MED ORDER — HYDROMORPHONE 0.3 MG/ML IV SOLN: INTRAVENOUS | Status: DC

## 2012-03-27 MED ORDER — HYDROMORPHONE HCL PF 1 MG/ML IJ SOLN
1.0000 mg | Freq: Once | INTRAMUSCULAR | Status: AC
  Administered 2012-03-27: 1 mg via INTRAVENOUS
  Filled 2012-03-27: qty 1

## 2012-03-27 MED ORDER — DOCUSATE SODIUM 100 MG PO CAPS
100.0000 mg | ORAL_CAPSULE | Freq: Every day | ORAL | Status: DC
  Administered 2012-03-28 – 2012-03-30 (×3): 100 mg via ORAL
  Filled 2012-03-27 (×3): qty 1

## 2012-03-27 MED ORDER — ZOLPIDEM TARTRATE 5 MG PO TABS: 5.0000 mg | ORAL_TABLET | Freq: Every evening | ORAL | Status: DC | PRN

## 2012-03-27 MED ORDER — PRENATAL MULTIVITAMIN CH
1.0000 | ORAL_TABLET | Freq: Every day | ORAL | Status: DC
  Administered 2012-03-28 – 2012-03-29 (×2): 1 via ORAL
  Filled 2012-03-27 (×3): qty 1

## 2012-03-27 MED ORDER — DIPHENHYDRAMINE HCL 50 MG/ML IJ SOLN: 12.5000 mg | Freq: Four times a day (QID) | INTRAMUSCULAR | Status: DC | PRN

## 2012-03-27 MED ORDER — TAMSULOSIN HCL 0.4 MG PO CAPS
0.4000 mg | ORAL_CAPSULE | Freq: Every day | ORAL | Status: DC
  Administered 2012-03-27 – 2012-03-29 (×3): 0.4 mg via ORAL
  Filled 2012-03-27 (×3): qty 1

## 2012-03-27 MED ORDER — ACETAMINOPHEN 325 MG PO TABS: 650.0000 mg | ORAL_TABLET | ORAL | Status: DC | PRN

## 2012-03-27 MED ORDER — LACTATED RINGERS IV SOLN
INTRAVENOUS | Status: DC
  Administered 2012-03-27 – 2012-03-30 (×8): via INTRAVENOUS

## 2012-03-27 MED ORDER — ONDANSETRON HCL 4 MG/2ML IJ SOLN
4.0000 mg | Freq: Four times a day (QID) | INTRAMUSCULAR | Status: DC | PRN
  Filled 2012-03-27: qty 2

## 2012-03-27 MED ORDER — NALOXONE HCL 0.4 MG/ML IJ SOLN: 0.4000 mg | INTRAMUSCULAR | Status: DC | PRN

## 2012-03-27 NOTE — MAU Note (Signed)
Pt reports severe back pain x 1 hour; has hx of kidney stones

## 2012-03-27 NOTE — MAU Provider Note (Signed)
History     CSN: [id number]  Arrival date and time: 03/27/12 1720   None     Chief Complaint  Patient presents with  . Back Pain   HPI This is a 36 y.o. female at [redacted]w[redacted]d who presents with severe R flank pain for an hour. States it feels the same as when she had a kidney stone before. Denies dysuria or fever. Denies contractions. + fetal movement.   RN Note: Pt reports severe back pain x 1 hour; has hx of kidney stones      OB History    Grav Para Term Preterm Abortions TAB SAB Ect Mult Living   12 2 2  9 2 7   2       Past Medical History  Diagnosis Date  . Anxiety   . Depression     Past Surgical History  Procedure Date  . Femur fracture surgery   . Wisdom tooth extraction     Family History  Problem Relation Age of Onset  . Bipolar disorder Mother   . Bipolar disorder Sister   . Bipolar disorder Brother   . Diabetes Maternal Grandmother   . Cancer Maternal Grandfather     History  Substance Use Topics  . Smoking status: Former Smoker -- 0.2 packs/day for 18 years  . Smokeless tobacco: Former Neurosurgeon    Quit date: 11/15/2011  . Alcohol Use: No    Allergies:  Allergies  Allergen Reactions  . Axid (Nizatidine) Hives    Prescriptions prior to admission  Medication Sig Dispense Refill  . Prenatal Vit-Fe Fumarate-FA (PRENATAL MULTIVITAMIN) TABS Take 1 tablet by mouth daily.      . promethazine (PHENERGAN) 12.5 MG tablet Take 1 tablet (12.5 mg total) by mouth every 6 (six) hours as needed for nausea.  30 tablet  0    ROS See HPI  Physical Exam   Blood pressure 117/62, pulse 83, temperature 98.5 F (36.9 C), temperature source Oral, resp. rate 20, last menstrual period 09/20/2011.  Physical Exam  Constitutional: She is oriented to person, place, and time. She appears well-developed and well-nourished. She appears distressed (with pain).  HENT:  Head: Normocephalic.  Cardiovascular: Normal rate.   Respiratory: Effort normal.  GI: Soft. She  exhibits no distension and no mass. There is tenderness (Over Right Flank). There is no rebound and no guarding.  Genitourinary: No vaginal discharge found.  Musculoskeletal: Normal range of motion.  Neurological: She is alert and oriented to person, place, and time.  Skin: Skin is warm and dry.  Psychiatric: She has a normal mood and affect.   Results for orders placed during the hospital encounter of 03/27/12 (from the past 24 hour(s))  URINALYSIS, ROUTINE W REFLEX MICROSCOPIC     Status: Abnormal   Collection Time   03/27/12  5:30 PM      Component Value Range   Color, Urine YELLOW  YELLOW   APPearance CLOUDY (*) CLEAR   Specific Gravity, Urine 1.020  1.005 - 1.030   pH 6.5  5.0 - 8.0   Glucose, UA NEGATIVE  NEGATIVE mg/dL   Hgb urine dipstick LARGE (*) NEGATIVE   Bilirubin Urine NEGATIVE  NEGATIVE   Ketones, ur NEGATIVE  NEGATIVE mg/dL   Protein, ur NEGATIVE  NEGATIVE mg/dL   Urobilinogen, UA 0.2  0.0 - 1.0 mg/dL   Nitrite NEGATIVE  NEGATIVE   Leukocytes, UA TRACE (*) NEGATIVE  URINE MICROSCOPIC-ADD ON     Status: Abnormal   Collection Time  03/27/12  5:30 PM      Component Value Range   Squamous Epithelial / LPF MANY (*) RARE   WBC, UA 0-2  <3 WBC/hpf   RBC / HPF 21-50  <3 RBC/hpf   Bacteria, UA FEW (*) RARE    MAU Course  Procedures  MDM Discussed with Dr Jackelyn Knife Will medicate for pain and get renal US  >> US Renal  03/27/2012  *RADIOLOGY REPORT*  Clinical Data:  Severe right flank pain  RENAL/URINARY TRACT ULTRASOUND COMPLETE  Comparison:  None.  Findings:  Right Kidney:  Normal in size and parenchymal echogenicity. There is a grade II hydro nephrosis/hydroureter.  Proximal ureter measures 7 mm in diameter. No visual dilatation of the distal right ureter.  Left Kidney:  Normal in size and parenchymal echogenicity.  No evidence of mass or hydronephrosis.  Bladder:  Appears normal for degree of bladder distention.  IMPRESSION:  1.  Grade II right  hydronephrosis/hydroureter.  No stone identified.   Original Report Authenticated By: Signa Kell, M.D.     Assessment and Plan  A:  SIUP at [redacted]w[redacted]d        Probable R lithiasis P:  Plan per Dr Jackelyn Knife  Wynelle Bourgeois 03/27/2012, 5:57 PM   Pain recurred one hour after first dose of Dilaudid (which gave her pretty good relief).  After second dose of Dilaudid, states pain is worse and is a little lower. Will give 2nd mg of Dilaudid and check with Dr Jackelyn Knife when he gets out of surgery. Suspect stone is moving, though [place] did not identify a stone per se, she does have significant hydroureter.    Consulted Dr Jackelyn Knife who will come see patient.

## 2012-03-27 NOTE — H&P (Signed)
Chief Complaint   Patient presents with   .  Back Pain    HPI  This is a 36 y.o. female at [redacted]w[redacted]d who presents with severe R flank pain for an hour. States it feels the same as when she had a kidney stone before. Denies dysuria or fever. Denies contractions. + fetal movement.  RN Note:  Pt reports severe back pain x 1 hour; has hx of kidney stones      OB History    Grav  Para  Term  Preterm  Abortions  TAB  SAB  Ect  Mult  Living    12  2  2   9  2  7    2       Past Medical History   Diagnosis  Date   .  Anxiety    .  Depression     Past Surgical History   Procedure  Date   .  Femur fracture surgery    .  Wisdom tooth extraction     Family History   Problem  Relation  Age of Onset   .  Bipolar disorder  Mother    .  Bipolar disorder  Sister    .  Bipolar disorder  Brother    .  Diabetes  Maternal Grandmother    .  Cancer  Maternal Grandfather     History   Substance Use Topics   .  Smoking status:  Former Smoker -- 0.2 packs/day for 18 years   .  Smokeless tobacco:  Former Neurosurgeon     Quit date:  11/15/2011   .  Alcohol Use:  No    Allergies:  Allergies   Allergen  Reactions   .  Axid (Nizatidine)  Hives    Prescriptions prior to admission   Medication  Sig  Dispense  Refill   .  Prenatal Vit-Fe Fumarate-FA (PRENATAL MULTIVITAMIN) TABS  Take 1 tablet by mouth daily.     .  promethazine (PHENERGAN) 12.5 MG tablet  Take 1 tablet (12.5 mg total) by mouth every 6 (six) hours as needed for nausea.  30 tablet  0    ROS  See HPI  Physical Exam   Blood pressure 117/62, pulse 83, temperature 98.5 F (36.9 C), temperature source Oral, resp. rate 20, last menstrual period 09/20/2011.  Physical Exam  Constitutional: She is oriented to person, place, and time. She appears well-developed and well-nourished. She appears distressed (with pain).  HENT:  Head: Normocephalic.  Cardiovascular: Normal rate.  Respiratory: Effort normal.  GI: Soft. She exhibits no distension and no  mass. There is tenderness (Over Right Flank). There is no rebound and no guarding.  Genitourinary: No vaginal discharge found.  Musculoskeletal: Normal range of motion.  Neurological: She is alert and oriented to person, place, and time.  Skin: Skin is warm and dry.  Psychiatric: She has a normal mood and affect.   Results for orders placed during the hospital encounter of 03/27/12 (from the past 24 hour(s))   URINALYSIS, ROUTINE W REFLEX MICROSCOPIC Status: Abnormal    Collection Time    03/27/12 5:30 PM   Component  Value  Range    Color, Urine  YELLOW  YELLOW    APPearance  CLOUDY (*)  CLEAR    Specific Gravity, Urine  1.020  1.005 - 1.030    pH  6.5  5.0 - 8.0    Glucose, UA  NEGATIVE  NEGATIVE mg/dL    Hgb urine dipstick  LARGE (*)  NEGATIVE    Bilirubin Urine  NEGATIVE  NEGATIVE    Ketones, ur  NEGATIVE  NEGATIVE mg/dL    Protein, ur  NEGATIVE  NEGATIVE mg/dL    Urobilinogen, UA  0.2  0.0 - 1.0 mg/dL    Nitrite  NEGATIVE  NEGATIVE    Leukocytes, UA  TRACE (*)  NEGATIVE   URINE MICROSCOPIC-ADD ON Status: Abnormal    Collection Time    03/27/12 5:30 PM   Component  Value  Range    Squamous Epithelial / LPF  MANY (*)  RARE    WBC, UA  0-2  <3 WBC/hpf    RBC / HPF  21-50  <3 RBC/hpf    Bacteria, UA  FEW (*)  RARE    MAU Course   Procedures  MDM  Discussed with Dr Jackelyn Knife  Will medicate for pain and get renal US >> US Renal  03/27/2012 *RADIOLOGY REPORT* Clinical Data: Severe right flank pain RENAL/URINARY TRACT ULTRASOUND COMPLETE Comparison: None. Findings: Right Kidney: Normal in size and parenchymal echogenicity. There is a grade II hydro nephrosis/hydroureter. Proximal ureter measures 7 mm in diameter. No visual dilatation of the distal right ureter. Left Kidney: Normal in size and parenchymal echogenicity. No evidence of mass or hydronephrosis. Bladder: Appears normal for degree of bladder distention. IMPRESSION: 1. Grade II right hydronephrosis/hydroureter. No stone  identified. Original Report Authenticated By: Signa Kell, M.D.  Assessment and Plan   A: SIUP at [redacted]w[redacted]d  Probable R lithiasis  P: Plan per Dr Jackelyn Knife  Wynelle Bourgeois  03/27/2012, 5:57 PM  Pain recurred one hour after first dose of Dilaudid (which gave her pretty good relief). After second dose of Dilaudid, states pain is worse and is a little lower. Will give 2nd mg of Dilaudid and check with Dr Jackelyn Knife when he gets out of surgery. Suspect stone is moving, though [place] did not identify a stone per se, she does have significant hydroureter.  Consulted Dr Jackelyn Knife who will come see patient.   I have seen this pt, I do not think she will tolerate PO pain meds.  Will admit for observation, PCA for pain control, try Flomax to help stone move through.

## 2012-03-28 ENCOUNTER — Encounter (HOSPITAL_COMMUNITY): Payer: Self-pay | Admitting: Obstetrics and Gynecology

## 2012-03-28 LAB — BASIC METABOLIC PANEL
CO2: 24 mEq/L (ref 19–32)
Calcium: 8.5 mg/dL (ref 8.4–10.5)
Creatinine, Ser: 0.49 mg/dL — ABNORMAL LOW (ref 0.50–1.10)
GFR calc non Af Amer: >90 mL/min (ref >90)
Glucose, Bld: 103 mg/dL — ABNORMAL HIGH (ref 70–99)
Sodium: 136 mEq/L (ref 135–145)

## 2012-03-28 LAB — CBC
MCH: 31.4 pg (ref 26.0–34.0)
MCV: 93 fL (ref 78.0–100.0)
Platelets: 200 10*3/uL (ref 150–400)
RDW: 12.9 % (ref 11.5–15.5)

## 2012-03-28 NOTE — Progress Notes (Signed)
HD #2 27W 1D, probable kidney stone Feels the same, still in pain, using PCA Afeb, VSS, +FHT Due to persistent pain, hematuria, dilated proximal right ureter on u/s, will get urology eval for further guidance

## 2012-03-28 NOTE — Consult Note (Signed)
Urology Consult   Physician requesting consult: Meisinger  Reason for consult: Right hydroureteronephrosis  History of Present Illness: Tammy Green is a 36 y.o. cauc female with PMH significant for anxiety and depression who is also [redacted] weeks pregnant.  She presented to [facility] hospital yesterday for evaluation of severe right back and flank pain.  She states it started yesterday and has persisted without N/V or F/C.  She has urinary frequency.  Pt states she passed a kidney stone at the beginning of this pregnancy and her current sx are the same as that episode.  Prior to that she had no hx of stones.   RUS revealed significant right sided hydroureteronephrosis.  UA is positive for micro hematuria without infection.  Pt was admitted and started on a Dilaudid PCA.  She is currently comfortable but is using the PCA as soon as she feels discomfort.  She denies F/C, dizziness, CP, SOB, N/V, diarrhea/constipation, and dysuria.  She states she has had recurrent UTIs in the past, however, she stopped going to the doctor for eval because she does not have insurance.  Her sx of frequency and dysuria improve with use of OTC cranberry pills and Azo. She denies a history of hematuria and GU malignancy/trauma/surgery.   Past Medical History  Diagnosis Date  . Anxiety   . Depression     Past Surgical History  Procedure Date  . Femur fracture surgery   . Wisdom tooth extraction      Current Hospital Medications:  Home meds:  Prior to Admission medications   Medication Sig Start Date End Date Taking? Authorizing Provider  Prenatal Vit-Fe Fumarate-FA (PRENATAL MULTIVITAMIN) TABS Take 1 tablet by mouth daily.   Yes Historical Provider, MD  promethazine (PHENERGAN) 12.5 MG tablet Take 1 tablet (12.5 mg total) by mouth every 6 (six) hours as needed for nausea. 11/02/11 11/09/11  Elta Guadeloupe, NP    Scheduled Meds:   . docusate sodium  100 mg Oral Daily  . [COMPLETED]  HYDROmorphone (DILAUDID)  injection  1 mg Intravenous Once  . [COMPLETED]  HYDROmorphone (DILAUDID) injection  1 mg Intravenous Once  . [COMPLETED]  HYDROmorphone (DILAUDID) injection  1 mg Intravenous Once  . HYDROmorphone PCA 0.3 mg/mL   Intravenous Q4H  . prenatal multivitamin  1 tablet Oral Daily  . Tamsulosin HCl  0.4 mg Oral QHS  . [DISCONTINUED] HYDROmorphone PCA 0.3 mg/mL   Intravenous Q4H   Continuous Infusions:   . lactated ringers 125 mL/hr at 03/28/12 0629  . [DISCONTINUED] lactated ringers 500 mL/hr at 03/27/12 2008   PRN Meds:.acetaminophen, calcium carbonate, diphenhydrAMINE, diphenhydrAMINE, naloxone, ondansetron (ZOFRAN) IV, promethazine, sodium chloride, zolpidem  Allergies:  Allergies  Allergen Reactions  . Axid (Nizatidine) Hives    Family History  Problem Relation Age of Onset  . Bipolar disorder Mother   . Bipolar disorder Sister   . Bipolar disorder Brother   . Diabetes Maternal Grandmother   . Cancer Maternal Grandfather     Social History:  reports that she has quit smoking. She quit smokeless tobacco use about 4 months ago. She reports that she does not drink alcohol or use illicit drugs.  ROS: A complete review of systems was performed.  All systems are negative except for pertinent findings as noted.  Physical Exam:  Vital signs in last 24 hours: Temp:  [97.3 F (36.3 C)-98.5 F (36.9 C)] 97.3 F (36.3 C) (12/03 1019) Pulse Rate:  [60-83] 60  (12/03 0553) Resp:  [12-20] 12  (  12/03 1019) BP: (91-126)/(52-70) 102/52 mmHg (12/03 1019) SpO2:  [93 %-98 %] 94 % (12/03 1019) Weight:  [87.998 kg (194 lb)] 87.998 kg (194 lb) (12/02 2236) General:  Alert and oriented, No acute distress HEENT: Normocephalic, atraumatic Neck: No JVD or lymphadenopathy Cardiovascular: Regular rate and rhythm Lungs: Clear bilaterally Abdomen: Soft, nontender, pregnant Back: right CVA tenderness Extremities: No edema Neurologic: Grossly intact  Laboratory Data:  No results found for this  basename: WBC:5,HGB:5,HCT:5,PLT:5 in the last 72 hours  No results found for this basename: NA:5,K:5,CL:5,CO3:5,GLUCOSE:5,BUN:5,CALCIUM:5,CREATININE:5 in the last 72 hours   Results for orders placed during the hospital encounter of 03/27/12 (from the past 24 hour(s))  URINALYSIS, ROUTINE W REFLEX MICROSCOPIC     Status: Abnormal   Collection Time   03/27/12  5:30 PM      Component Value Range   Color, Urine YELLOW  YELLOW   APPearance CLOUDY (*) CLEAR   Specific Gravity, Urine 1.020  1.005 - 1.030   pH 6.5  5.0 - 8.0   Glucose, UA NEGATIVE  NEGATIVE mg/dL   Hgb urine dipstick LARGE (*) NEGATIVE   Bilirubin Urine NEGATIVE  NEGATIVE   Ketones, ur NEGATIVE  NEGATIVE mg/dL   Protein, ur NEGATIVE  NEGATIVE mg/dL   Urobilinogen, UA 0.2  0.0 - 1.0 mg/dL   Nitrite NEGATIVE  NEGATIVE   Leukocytes, UA TRACE (*) NEGATIVE  URINE MICROSCOPIC-ADD ON     Status: Abnormal   Collection Time   03/27/12  5:30 PM      Component Value Range   Squamous Epithelial / LPF MANY (*) RARE   WBC, UA 0-2  <3 WBC/hpf   RBC / HPF 21-50  <3 RBC/hpf   Bacteria, UA FEW (*) RARE     Renal Function: No results found for this basename: CREATININE:7 in the last 168 hours CrCl is unknown because no creatinine reading has been taken.  Radiologic Imaging: US Renal  03/27/2012  *RADIOLOGY REPORT*  Clinical Data:  Severe right flank pain  RENAL/URINARY TRACT ULTRASOUND COMPLETE  Comparison:  None.  Findings:  Right Kidney:  Normal in size and parenchymal echogenicity. There is a grade II hydro nephrosis/hydroureter.  Proximal ureter measures 7 mm in diameter. No visual dilatation of the distal right ureter.  Left Kidney:  Normal in size and parenchymal echogenicity.  No evidence of mass or hydronephrosis.  Bladder:  Appears normal for degree of bladder distention.  IMPRESSION:  1.  Grade II right hydronephrosis/hydroureter.  No stone identified.   Original Report Authenticated By: Signa Kell, M.D.      Impression/Assessment/Plan:  Right sided hydroureteronephrosis with etiology of stone vs fetus.  Recommend continued conservative management with pain control, Flomax, and straining of urine.  If her pain persists, sx worsen, or she does not pass a stone, will need to consider CT to eval.   If intervention becomes necessary she will likely require perc nephrostomy.  Dr. Isabel Caprice will discuss with Dr. Jackelyn Knife.  Will get CBC and BMP as she has not had any labs.     Silas Flood 03/28/2012, 10:52 AM    Addendum: I have reviewed Tammy Green's chart and discussed finding with Tammy Green. Hopefully the patient has a small distal stone that will pass spontaneously over the next 24 hours. If her pain improves then she may be able to be discharged and monitored as an outpatient. If she persists in having significant discomfort and requiring constant narcotic use then she should undergo a low dose noncontrast CT  for definitive diagnosis. If a stone is noted then the decision whether to continue with attempts at spontaneous passage versus placement of a percutaneous nephrostomy tube will need to be discussed with the patient and OB/GYN service. In this situation our prefer a percutaneous nephrostomy tube to an indwelling stent. The nephrostomy tube would be placed by interventional radiology. I attempted to discuss things with Dr. Jackelyn Knife today but he was out of the office and hopefully we can communicate tomorrow.

## 2012-03-29 ENCOUNTER — Observation Stay (HOSPITAL_COMMUNITY): Payer: Medicaid Other

## 2012-03-29 NOTE — Progress Notes (Signed)
HD#3, [redacted]W[redacted]D, hydronephrosis/hematuria Still having pain, a little better than yesterday but still significant Afeb, VSS Nl BMP with nl Cre Urology note reviewed and appreciated Since pain has not resolved, will discuss imaging and further treatment with Dr. Isabel Caprice

## 2012-03-29 NOTE — Progress Notes (Signed)
Patient ID: Tammy Green, female   DOB: 07/17/1975, 36 y.o.   MRN: [id number]   Subjective: Patient reports ongoing moderate discomfort. The patient had a repeat renal ultrasound. There was at least moderate hydronephrosis. Transvaginal imaging was able to reveal a distal stone estimated at 6 mm size. The patient does have a nonobstructing stone in the left kidney.  Objective: Vital signs in last 24 hours: Temp:  [97.6 F (36.4 C)-98.1 F (36.7 C)] 98 F (36.7 C) (12/04 1022) Pulse Rate:  [63-91] 83  (12/04 1022) Resp:  [10-19] 18  (12/04 1022) BP: (93-107)/(52-64) 104/62 mmHg (12/04 1022) SpO2:  [92 %-99 %] 95 % (12/04 1022) Weight:  [93.441 kg (206 lb)-93.554 kg (206 lb 4 oz)] 93.441 kg (206 lb) (12/04 0805)  Intake/Output from previous day: 12/03 0701 - 12/04 0700 In: 4451.1 [P.O.:720; I.V.:3731.1] Out: 1700 [Urine:1700] Intake/Output this shift: Total I/O In: -  Out: 200 [Urine:200]  Physical Exam:  Constitutional: Vital signs reviewed. WD WN in NAD   Eyes: PERRL, No scleral icterus.   Cardiovascular: RRR Pulmonary/Chest: Normal effort   Lab Results:  Basename 03/28/12 1213  HGB 10.7*  HCT 31.7*   BMET  Basename 03/28/12 1055  NA 136  K 3.6  CL 101  CO2 24  GLUCOSE 103*  BUN 6  CREATININE 0.49*  CALCIUM 8.5   No results found for this basename: LABPT:3,INR:3 in the last 72 hours No results found for this basename: LABURIN:1 in the last 72 hours Results for orders placed during the hospital encounter of 11/02/11  WET PREP, GENITAL     Status: Abnormal   Collection Time   11/02/11  2:00 PM      Component Value Range Status Comment   Yeast Wet Prep HPF POC NONE SEEN  NONE SEEN Final    Trich, Wet Prep MODERATE (*) NONE SEEN Final    Clue Cells Wet Prep HPF POC MANY (*) NONE SEEN Final    WBC, Wet Prep HPF POC TOO NUMEROUS TO COUNT (*) NONE SEEN Final BACTERIA- TOO NUMEROUS TO COUNT    Studies/Results: US Ob Transvaginal  03/29/2012  *RADIOLOGY REPORT*   Clinical Data: Right-sided flank pain, positive pregnancy test. Prior history of renal calculi  RENAL/URINARY TRACT ULTRASOUND COMPLETE AND LIMITED TRANSVAGINAL PELVIC ULTRASOUND  Comparison:  CT abdomen/pelvis 01/02/2005, renal ultrasound 03/27/2012  Findings:  Right Kidney:  13.4 cm.  Moderate right hydronephrosis and proximal right hydroureter noted, slightly increased since the previous recent exam. No echogenic shadowing right renal calculus is identified.  Left Kidney:  12.1 cm.  No hydronephrosis or solid mass.  An echogenic shadowing calculus at the mid left kidney measures 6 mm. A smaller mid/inferior nonobstructing left renal calculus measures 3 mm.  Bladder:  Normal  Transvaginal imaging of the bladder and ureterovesicular junction was then performed, demonstrating a 6 mm distal right ureteral/ureterovesicular junction calculus.  No distal left ureterectasis or calculus is identified.  IMPRESSION: Increased now moderate right hydronephrosis and proximal hydroureter, with visualization at transvaginal imaging of a 6 mm distal right ureteral/ureterovesicular junction calculus. These results were called by telephone on 03/29/2012 at 1000am to Dr. Jackelyn Knife, who verbally acknowledged these results.   Original Report Authenticated By: Christiana Pellant, M.D.    US Renal  03/29/2012  *RADIOLOGY REPORT*  Clinical Data: Right-sided flank pain, positive pregnancy test. Prior history of renal calculi  RENAL/URINARY TRACT ULTRASOUND COMPLETE AND LIMITED TRANSVAGINAL PELVIC ULTRASOUND  Comparison:  CT abdomen/pelvis 01/02/2005, renal ultrasound 03/27/2012  Findings:  Right Kidney:  13.4 cm.  Moderate right hydronephrosis and proximal right hydroureter noted, slightly increased since the previous recent exam. No echogenic shadowing right renal calculus is identified.  Left Kidney:  12.1 cm.  No hydronephrosis or solid mass.  An echogenic shadowing calculus at the mid left kidney measures 6 mm. A smaller mid/inferior  nonobstructing left renal calculus measures 3 mm.  Bladder:  Normal  Transvaginal imaging of the bladder and ureterovesicular junction was then performed, demonstrating a 6 mm distal right ureteral/ureterovesicular junction calculus.  No distal left ureterectasis or calculus is identified.  IMPRESSION: Increased now moderate right hydronephrosis and proximal hydroureter, with visualization at transvaginal imaging of a 6 mm distal right ureteral/ureterovesicular junction calculus. These results were called by telephone on 03/29/2012 at 1000am to Dr. Jackelyn Knife, who verbally acknowledged these results.   Original Report Authenticated By: Christiana Pellant, M.D.    US Renal  03/27/2012  *RADIOLOGY REPORT*  Clinical Data:  Severe right flank pain  RENAL/URINARY TRACT ULTRASOUND COMPLETE  Comparison:  None.  Findings:  Right Kidney:  Normal in size and parenchymal echogenicity. There is a grade II hydro nephrosis/hydroureter.  Proximal ureter measures 7 mm in diameter. No visual dilatation of the distal right ureter.  Left Kidney:  Normal in size and parenchymal echogenicity.  No evidence of mass or hydronephrosis.  Bladder:  Appears normal for degree of bladder distention.  IMPRESSION:  1.  Grade II right hydronephrosis/hydroureter.  No stone identified.   Original Report Authenticated By: Signa Kell, M.D.     Assessment/Plan:   Ms. Virgen has approximately 6 mm distal right ureteral calculus with moderate hydronephrosis and ongoing discomfort. She has had no complicating factors such it urinary tract infection and fever etc. Spontaneous passage rates for a stone this size, to be approximately 50%. It would be reasonable to give her an additional 24-48 hours to try to pass the stone spontaneously and potentially much longer if her pain could be better controlled. Otherwise she will need intervention. I would recommend interventional radiology place a nephrostomy tube. That can be placed without the need for  general anesthesia and generally under ultrasound guidance. That potentially could be internalized to a double-J stent at a later date.   LOS: 2 days   Arslan Kier S 03/29/2012, 1:16 PM

## 2012-03-29 NOTE — Progress Notes (Signed)
Ur chart review completed.  

## 2012-03-30 DIAGNOSIS — N200 Calculus of kidney: Secondary | ICD-10-CM | POA: Diagnosis present

## 2012-03-30 MED ORDER — OXYCODONE-ACETAMINOPHEN 5-325 MG PO TABS: 1.0000 | ORAL_TABLET | ORAL | Status: DC | PRN

## 2012-03-30 MED ORDER — OXYCODONE-ACETAMINOPHEN 5-325 MG PO TABS
1.0000 | ORAL_TABLET | ORAL | Status: DC | PRN
  Administered 2012-03-30: 1 via ORAL
  Filled 2012-03-30: qty 1

## 2012-03-30 NOTE — Progress Notes (Signed)
HD #3 [redacted]W[redacted]D, kidney stones Passed a small stone early this am.  Pain is better but still sore Afeb, VSS Will d/c PCA and IV fluids, make sure she is ok this am, d/c home at lunchtime if stable.

## 2012-03-30 NOTE — Discharge Summary (Signed)
Physician Discharge Summary  Patient ID: GENELLA BAS MRN: [id number] DOB/AGE: 1975/09/04 36 y.o.  Admit date: 03/27/2012 Discharge date: 03/30/2012  Admission Diagnoses:  IUP at 27 weeks, kidney stones  Discharge Diagnoses:  IUP at 27 weeks, kidney stones Active Problems:  Kidney stones   Discharged Condition: good  Hospital Course: Pt admitted and put on Dilaudid PCA for pain control and flomax to try to help pass stone.  Ultrasound on 12-5 was able to confirm a 6 mm stone on her left.  She passed the stone early am 12-6, pain improved and she was felt stable for discharge  Consults: urology, Dr. Isabel Caprice  Significant Diagnostic Studies: radiology: Ultrasound: renal   Discharge Exam: Blood pressure 109/59, pulse 87, temperature 98.6 F (37 C), temperature source Oral, resp. rate 18, height 5\' 7"  (1.702 m), weight 93.441 kg (206 lb), last menstrual period 09/20/2011, SpO2 99.00%. General appearance: alert  Disposition: 01-Home or Self Care  Discharge Orders    Future Orders Please Complete By Expires   PRETERM LABOR:  Includes any of the follwing symptoms that occur between 20 - [redacted] weeks gestation.  If these symptoms are not stopped, preterm labor can result in preterm delivery, placing your baby at risk      Notify physician for menstrual like cramps      Notify physician for uterine contractions.  These may be painless and feel like the uterus is tightening or the baby is  "balling up"      Notify physician for low, dull backache, unrelieved by heat or Tylenol      Notify physician for intestinal cramps, with or without diarrhea, sometimes described as "gas pain"      Notify physician for pelvic pressure      Notify physician for increase or change in vaginal discharge      Notify physician for vaginal bleeding      Notify physician for a general feeling that "something is not right"      Notify physician for leaking of fluid      Discharge activity:  No Restrictions      Discharge diet:  No restrictions          Medication List     As of 03/30/2012 12:30 PM    TAKE these medications         oxyCODONE-acetaminophen 5-325 MG per tablet   Commonly known as: PERCOCET/ROXICET   Take 1 tablet by mouth every 4 (four) hours as needed.      prenatal multivitamin Tabs   Take 1 tablet by mouth daily.      promethazine 12.5 MG tablet   Commonly known as: PHENERGAN   Take 1 tablet (12.5 mg total) by mouth every 6 (six) hours as needed for nausea.           Follow-up Information    Follow up with Lorenna Lurry D, MD. (keep appointments as scheduled)    Contact information:   [address], [address] [place] [address] [phone]          Signed: Zenaida Niece 03/30/2012, 12:30 PM

## 2012-03-30 NOTE — Progress Notes (Signed)
Pt discharged to home with fiancee.  Condition stable.  Pt ambulated to car with L. Isidore Moos, NT.  Pt to stop by Dr. 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 No equipment for home ordered at discharge.

## 2012-05-04 ENCOUNTER — Inpatient Hospital Stay (HOSPITAL_COMMUNITY)
Admission: AD | Admit: 2012-05-04 | Discharge: 2012-05-04 | Disposition: A | Discharge status: Home / Self Care | Payer: Medicaid Other | Source: Ambulatory Visit | Attending: Obstetrics and Gynecology | Admitting: Obstetrics and Gynecology

## 2012-05-04 ENCOUNTER — Encounter (HOSPITAL_COMMUNITY): Payer: Self-pay | Admitting: *Deleted

## 2012-05-04 DIAGNOSIS — O4410 Placenta previa with hemorrhage, unspecified trimester: Secondary | ICD-10-CM | POA: Insufficient documentation

## 2012-05-04 DIAGNOSIS — O368190 Decreased fetal movements, unspecified trimester, not applicable or unspecified: Secondary | ICD-10-CM | POA: Insufficient documentation

## 2012-05-04 DIAGNOSIS — O4400 Placenta previa specified as without hemorrhage, unspecified trimester: Secondary | ICD-10-CM

## 2012-05-04 HISTORY — DX: Calculus of kidney: N20.0

## 2012-05-04 NOTE — MAU Provider Note (Signed)
Chief Complaint:  No chief complaint on file.     HPI: Tammy Green is a 37 y.o. [id number] at [redacted]w[redacted]d who presents to maternity admissions reporting decreased FM today since office visit. Feels 2-3 FM's per hour and feels some of the movements audible on EFM. Concerned that [place] photo appears to have cord around baby's neck and tech told her baby "was having trouble breathing." Denies contractions, leakage of fluid or vaginal bleeding.   Pregnancy Course: placenta previa; ureteral stone   Past Medical History: Past Medical History  Diagnosis Date  . Anxiety   . Depression   . Kidney stones 2013    Past obstetric history: OB History    Grav Para Term Preterm Abortions TAB SAB Ect Mult Living   12 2 1  9 2 7   2      # Outc Date GA Lbr Len/2nd Wgt Sex Del Anes PTL Lv   1 TAB            2 SAB            3 PAR     M SVD   Yes   4 SAB            5 SAB            6 SAB            7 TRM     M SVD   Yes   8 SAB            9 SAB            10 SAB            11 TAB            12 CUR               Past Surgical History: Past Surgical History  Procedure Date  . Femur fracture surgery   . Wisdom tooth extraction     Family History: Family History  Problem Relation Age of Onset  . Bipolar disorder Mother   . Bipolar disorder Sister   . Bipolar disorder Brother   . Diabetes Maternal Grandmother   . Cancer Maternal Grandfather     Social History: History  Substance Use Topics  . Smoking status: Former Smoker -- 0.2 packs/day for 18 years  . Smokeless tobacco: Former Neurosurgeon    Quit date: 11/15/2011  . Alcohol Use: No    Allergies:  Allergies  Allergen Reactions  . Axid (Nizatidine) Hives    Meds:  Prescriptions prior to admission  Medication Sig Dispense Refill  . acetaminophen (TYLENOL) 325 MG tablet Take 650 mg by mouth every 6 (six) hours as needed. Stomach pain      . oxyCODONE-acetaminophen (PERCOCET/ROXICET) 5-325 MG per tablet Take 1 tablet by mouth every 4  (four) hours as needed.  20 tablet  0  . Prenatal Vit-Fe Fumarate-FA (PRENATAL MULTIVITAMIN) TABS Take 1 tablet by mouth daily.      . promethazine (PHENERGAN) 12.5 MG tablet Take 1 tablet (12.5 mg total) by mouth every 6 (six) hours as needed for nausea.  30 tablet  0    ROS: Pertinent findings in history of present illness.  Physical Exam  Blood pressure 109/56, pulse 94, temperature 98.1 F (36.7 C), temperature source Oral, resp. rate 20, height 5\' 5"  (1.651 m), weight 203 lb 2 oz (92.137 kg), last menstrual period 09/20/2011. GENERAL: Well-developed, well-nourished female in no acute distress.  HEENT: normocephalic HEART: normal rate RESP: normal effort ABDOMEN: Soft, non-tender, gravid appropriate for gestational age EXTREMITIES: Nontender, no edema NEURO: alert and oriented SPECULUM EXAM: NEFG, physiologic discharge, no blood, cervix clean    FHT:  Baseline 145-150 , moderate variability, accelerations present, isolated mild variable deceleration Contractions: none    Assessment: 1. Decreased fetal movement   2. Placenta previa   G11 P1- 092  Plan: D/W Dr. Meisinger>Discharge home Labor precautions and fetal kick counts  Follow-up Information    Follow up with MEISINGER,TODD D, MD. (Keep yoiur scheduled appt.)    Contact information:   [address], [address] [place] [address] [phone]            Medication List     As of 05/04/2012 10:00 PM    TAKE these medications         acetaminophen 325 MG tablet   Commonly known as: TYLENOL   Take 650 mg by mouth every 6 (six) hours as needed. Stomach pain      oxyCODONE-acetaminophen 5-325 MG per tablet   Commonly known as: PERCOCET/ROXICET   Take 1 tablet by mouth every 4 (four) hours as needed.      prenatal multivitamin Tabs   Take 1 tablet by mouth daily.      promethazine 12.5 MG tablet   Commonly known as: PHENERGAN   Take 1 tablet (12.5 mg total) by mouth every 6 (six) hours as needed  for nausea.         Danae Orleans, CNM 05/04/2012 9:25 PM

## 2012-05-04 NOTE — MAU Note (Signed)
PT SAYS SHE FEELS BABY MOVE  BUT LESS-  HAD U/S TODAY- TOLD HER BABY WAS HAVING HARD TIME TO BREATHE- AND PT WAS LOOKING AT U/S PICTURE- AND THINKS SHE SEES A CORD AROUND BABY NECK- SO SHE WANTS TO BE EVALUATED.Marland Kitchen  VE IN OFFICE TODAY- PT HAS PREVIA-    CLOSED.  DENIES HSV AND MRSA.

## 2012-05-05 NOTE — Progress Notes (Signed)
FHT from 1-9 reviewed.  Reactive NST, one variable decel, no significant ctx.

## 2012-05-27 ENCOUNTER — Inpatient Hospital Stay (HOSPITAL_COMMUNITY)
Admission: AD | Admit: 2012-05-27 | Discharge: 2012-05-29 | DRG: 775 | Disposition: A | Discharge status: Home / Self Care | Payer: Medicaid Other | Source: Ambulatory Visit | Attending: Obstetrics and Gynecology | Admitting: Obstetrics and Gynecology

## 2012-05-27 ENCOUNTER — Inpatient Hospital Stay (HOSPITAL_COMMUNITY): Payer: Medicaid Other

## 2012-05-27 ENCOUNTER — Encounter (HOSPITAL_COMMUNITY): Payer: Self-pay | Admitting: Anesthesiology

## 2012-05-27 ENCOUNTER — Inpatient Hospital Stay (HOSPITAL_COMMUNITY): Payer: Medicaid Other | Admitting: Anesthesiology

## 2012-05-27 ENCOUNTER — Encounter (HOSPITAL_COMMUNITY): Payer: Self-pay | Admitting: *Deleted

## 2012-05-27 DIAGNOSIS — O09529 Supervision of elderly multigravida, unspecified trimester: Secondary | ICD-10-CM | POA: Diagnosis present

## 2012-05-27 DIAGNOSIS — O4290 Premature rupture of membranes, unspecified as to length of time between rupture and onset of labor, unspecified weeks of gestation: Principal | ICD-10-CM | POA: Diagnosis present

## 2012-05-27 LAB — CBC
HCT: 36.4 % (ref 36.0–46.0)
MCV: 91 fL (ref 78.0–100.0)
Platelets: 251 10*3/uL (ref 150–400)
RBC: 4 MIL/uL (ref 3.87–5.11)
WBC: 13.8 10*3/uL — ABNORMAL HIGH (ref 4.0–10.5)

## 2012-05-27 LAB — POCT FERN TEST: POCT Fern Test: POSITIVE

## 2012-05-27 MED ORDER — LANOLIN HYDROUS EX OINT: TOPICAL_OINTMENT | CUTANEOUS | Status: DC | PRN

## 2012-05-27 MED ORDER — IBUPROFEN 600 MG PO TABS: 600.0000 mg | ORAL_TABLET | Freq: Four times a day (QID) | ORAL | Status: DC | PRN

## 2012-05-27 MED ORDER — DIBUCAINE 1 % RE OINT
1.0000 "application " | TOPICAL_OINTMENT | RECTAL | Status: DC | PRN
  Filled 2012-05-27: qty 28

## 2012-05-27 MED ORDER — BUTORPHANOL TARTRATE 1 MG/ML IJ SOLN
INTRAMUSCULAR | Status: AC
  Filled 2012-05-27: qty 1

## 2012-05-27 MED ORDER — BENZOCAINE-MENTHOL 20-0.5 % EX AERO
1.0000 "application " | INHALATION_SPRAY | CUTANEOUS | Status: DC | PRN
  Filled 2012-05-27: qty 56

## 2012-05-27 MED ORDER — ONDANSETRON HCL 4 MG/2ML IJ SOLN: 4.0000 mg | INTRAMUSCULAR | Status: DC | PRN

## 2012-05-27 MED ORDER — OXYCODONE-ACETAMINOPHEN 5-325 MG PO TABS
1.0000 | ORAL_TABLET | ORAL | Status: DC | PRN
  Filled 2012-05-27 (×4): qty 1

## 2012-05-27 MED ORDER — SIMETHICONE 80 MG PO CHEW: 80.0000 mg | CHEWABLE_TABLET | ORAL | Status: DC | PRN

## 2012-05-27 MED ORDER — LACTATED RINGERS IV SOLN: 500.0000 mL | Freq: Once | INTRAVENOUS | Status: DC

## 2012-05-27 MED ORDER — EPHEDRINE 5 MG/ML INJ
10.0000 mg | INTRAVENOUS | Status: DC | PRN
  Filled 2012-05-27: qty 4

## 2012-05-27 MED ORDER — PHENYLEPHRINE 40 MCG/ML (10ML) SYRINGE FOR IV PUSH (FOR BLOOD PRESSURE SUPPORT)
80.0000 ug | PREFILLED_SYRINGE | INTRAVENOUS | Status: DC | PRN
  Filled 2012-05-27: qty 5

## 2012-05-27 MED ORDER — LACTATED RINGERS IV SOLN: INTRAVENOUS | Status: AC

## 2012-05-27 MED ORDER — FENTANYL 2.5 MCG/ML BUPIVACAINE 1/10 % EPIDURAL INFUSION (WH - ANES)
14.0000 mL/h | INTRAMUSCULAR | Status: DC
  Administered 2012-05-27: 14 mL/h via EPIDURAL
  Filled 2012-05-27: qty 125

## 2012-05-27 MED ORDER — LACTATED RINGERS IV SOLN: 500.0000 mL | INTRAVENOUS | Status: DC | PRN

## 2012-05-27 MED ORDER — ACETAMINOPHEN 325 MG PO TABS: 650.0000 mg | ORAL_TABLET | ORAL | Status: DC | PRN

## 2012-05-27 MED ORDER — DIPHENHYDRAMINE HCL 25 MG PO CAPS: 25.0000 mg | ORAL_CAPSULE | Freq: Four times a day (QID) | ORAL | Status: DC | PRN

## 2012-05-27 MED ORDER — ONDANSETRON HCL 4 MG PO TABS: 4.0000 mg | ORAL_TABLET | ORAL | Status: DC | PRN

## 2012-05-27 MED ORDER — PENICILLIN G POTASSIUM 5000000 UNITS IJ SOLR
2.5000 10*6.[IU] | INTRAVENOUS | Status: DC
  Administered 2012-05-27: 2.5 10*6.[IU] via INTRAVENOUS
  Filled 2012-05-27 (×4): qty 2.5

## 2012-05-27 MED ORDER — FLEET ENEMA 7-19 GM/118ML RE ENEM: 1.0000 | ENEMA | RECTAL | Status: DC | PRN

## 2012-05-27 MED ORDER — PENICILLIN G POTASSIUM 5000000 UNITS IJ SOLR
5.0000 10*6.[IU] | Freq: Once | INTRAVENOUS | Status: AC
  Administered 2012-05-27: 5 10*6.[IU] via INTRAVENOUS
  Filled 2012-05-27: qty 5

## 2012-05-27 MED ORDER — SENNOSIDES-DOCUSATE SODIUM 8.6-50 MG PO TABS
2.0000 | ORAL_TABLET | Freq: Every day | ORAL | Status: DC
  Administered 2012-05-27 – 2012-05-28 (×2): 2 via ORAL

## 2012-05-27 MED ORDER — LIDOCAINE HCL (PF) 1 % IJ SOLN
INTRAMUSCULAR | Status: DC | PRN
  Administered 2012-05-27 (×2): 5 mL

## 2012-05-27 MED ORDER — WITCH HAZEL-GLYCERIN EX PADS: 1.0000 "application " | MEDICATED_PAD | CUTANEOUS | Status: DC | PRN

## 2012-05-27 MED ORDER — TAMSULOSIN HCL 0.4 MG PO CAPS
0.4000 mg | ORAL_CAPSULE | Freq: Every day | ORAL | Status: DC | PRN
  Filled 2012-05-27: qty 1

## 2012-05-27 MED ORDER — EPHEDRINE 5 MG/ML INJ: 10.0000 mg | INTRAVENOUS | Status: DC | PRN

## 2012-05-27 MED ORDER — OXYTOCIN 40 UNITS IN LACTATED RINGERS INFUSION - SIMPLE MED
1.0000 m[IU]/min | INTRAVENOUS | Status: DC
  Administered 2012-05-27: 1 m[IU]/min via INTRAVENOUS
  Administered 2012-05-27: 3 m[IU]/min via INTRAVENOUS

## 2012-05-27 MED ORDER — LACTATED RINGERS IV SOLN
INTRAVENOUS | Status: DC
  Administered 2012-05-27 (×2): via INTRAVENOUS

## 2012-05-27 MED ORDER — OXYTOCIN 40 UNITS IN LACTATED RINGERS INFUSION - SIMPLE MED: 62.5000 mL/h | INTRAVENOUS | Status: AC | PRN

## 2012-05-27 MED ORDER — PRENATAL MULTIVITAMIN CH
1.0000 | ORAL_TABLET | Freq: Every day | ORAL | Status: DC
  Administered 2012-05-28 – 2012-05-29 (×2): 1 via ORAL
  Filled 2012-05-27 (×2): qty 1

## 2012-05-27 MED ORDER — OXYCODONE-ACETAMINOPHEN 5-325 MG PO TABS
1.0000 | ORAL_TABLET | ORAL | Status: DC | PRN
  Administered 2012-05-27 – 2012-05-29 (×9): 1 via ORAL
  Filled 2012-05-27 (×6): qty 1

## 2012-05-27 MED ORDER — PHENYLEPHRINE 40 MCG/ML (10ML) SYRINGE FOR IV PUSH (FOR BLOOD PRESSURE SUPPORT): 80.0000 ug | PREFILLED_SYRINGE | INTRAVENOUS | Status: DC | PRN

## 2012-05-27 MED ORDER — PRENATAL MULTIVITAMIN CH: 1.0000 | ORAL_TABLET | Freq: Every day | ORAL | Status: DC

## 2012-05-27 MED ORDER — ZOLPIDEM TARTRATE 5 MG PO TABS: 5.0000 mg | ORAL_TABLET | Freq: Every evening | ORAL | Status: DC | PRN

## 2012-05-27 MED ORDER — CITRIC ACID-SODIUM CITRATE 334-500 MG/5ML PO SOLN: 30.0000 mL | ORAL | Status: DC | PRN

## 2012-05-27 MED ORDER — MEASLES, MUMPS & RUBELLA VAC ~~LOC~~ INJ
0.5000 mL | INJECTION | Freq: Once | SUBCUTANEOUS | Status: DC
  Filled 2012-05-27: qty 0.5

## 2012-05-27 MED ORDER — OXYCODONE-ACETAMINOPHEN 5-325 MG PO TABS: 1.0000 | ORAL_TABLET | ORAL | Status: DC | PRN

## 2012-05-27 MED ORDER — OXYTOCIN 40 UNITS IN LACTATED RINGERS INFUSION - SIMPLE MED
62.5000 mL/h | INTRAVENOUS | Status: DC
  Filled 2012-05-27: qty 1000

## 2012-05-27 MED ORDER — LIDOCAINE HCL (PF) 1 % IJ SOLN
30.0000 mL | INTRAMUSCULAR | Status: DC | PRN
  Filled 2012-05-27: qty 30

## 2012-05-27 MED ORDER — BUTORPHANOL TARTRATE 1 MG/ML IJ SOLN
1.0000 mg | Freq: Once | INTRAMUSCULAR | Status: AC
  Administered 2012-05-27: 1 mg via INTRAVENOUS

## 2012-05-27 MED ORDER — DIPHENHYDRAMINE HCL 50 MG/ML IJ SOLN: 12.5000 mg | INTRAMUSCULAR | Status: DC | PRN

## 2012-05-27 MED ORDER — IBUPROFEN 600 MG PO TABS
600.0000 mg | ORAL_TABLET | Freq: Four times a day (QID) | ORAL | Status: DC
  Administered 2012-05-27 – 2012-05-29 (×7): 600 mg via ORAL
  Filled 2012-05-27 (×8): qty 1

## 2012-05-27 MED ORDER — OXYTOCIN BOLUS FROM INFUSION: 500.0000 mL | INTRAVENOUS | Status: DC

## 2012-05-27 MED ORDER — TETANUS-DIPHTH-ACELL PERTUSSIS 5-2.5-18.5 LF-MCG/0.5 IM SUSP
0.5000 mL | Freq: Once | INTRAMUSCULAR | Status: DC
  Filled 2012-05-27: qty 0.5

## 2012-05-27 MED ORDER — ONDANSETRON HCL 4 MG/2ML IJ SOLN: 4.0000 mg | Freq: Four times a day (QID) | INTRAMUSCULAR | Status: DC | PRN

## 2012-05-27 NOTE — Anesthesia Procedure Notes (Signed)
Epidural Patient location during procedure: OB Start time: 05/27/2012 11:32 AM  Staffing Anesthesiologist: Angus Seller., Harrell Gave. Performed by: anesthesiologist   Preanesthetic Checklist Completed: patient identified, site marked, surgical consent, pre-op evaluation, timeout performed, IV checked, risks and benefits discussed and monitors and equipment checked  Epidural Patient position: sitting Prep: site prepped and draped and DuraPrep Patient monitoring: continuous pulse ox and blood pressure Approach: midline Injection technique: LOR air and LOR saline  Needle:  Needle type: Tuohy  Needle gauge: 17 G Needle length: 9 cm and 9 Needle insertion depth: 6 cm Catheter type: closed end flexible Catheter size: 19 Gauge Catheter at skin depth: 12 cm Test dose: negative  Assessment Events: blood not aspirated, injection not painful, no injection resistance, negative IV test and no paresthesia  Additional Notes Patient identified.  Risk benefits discussed including failed block, incomplete pain control, headache, nerve damage, paralysis, blood pressure changes, nausea, vomiting, reactions to medication both toxic or allergic, and postpartum back pain.  Patient expressed understanding and wished to proceed.  All questions were answered.  Sterile technique used throughout procedure and epidural site dressed with sterile barrier dressing. No paresthesia or other complications noted.The patient did not experience any signs of intravascular injection such as tinnitus or metallic taste in mouth nor signs of intrathecal spread such as rapid motor block. Please see nursing notes for vital signs.

## 2012-05-27 NOTE — MAU Note (Signed)
Pt states her water broke at 520am large amount of clear fluid. States she has placenta previa and is suppose to be rescanned to evaluate if resolved this week. Ultrasound at 33 weeks hadn't resolved

## 2012-05-27 NOTE — Progress Notes (Signed)
Patient ID: Tammy Green, female   DOB: December 11, 1975, 37 y.o.   MRN: [id number] Delivery note:  The pt took a break and then resumed pushing and made excellent progress. When the baby crowned the pt went out of control and began screaming to pull the baby out. I informed her  To push the baby out since the risk of vacuum is increased with prematurity. She was able to deliver a living female infant ROA over an intact perineum, Apgars were 8 and 9 at 1 and 5 minutes. The placenta was removed intact and the uterus was normal. The pt was unable to allow a proper exam. There were no lacerations but the hymen was very irregular presumably from prior tears. EBL 400 cc's.

## 2012-05-27 NOTE — MAU Provider Note (Signed)
Tammy Green is a 37 y.o. [id number] at [redacted]w[redacted]d who presents to MAU today with ROM.   Pooling - positive Ferning - positive  Patient has a known previa.   RN to contact Dr. Ambrose Mantle for admit.   Freddi Starr, PA-C 05/27/2012 8:08 AM

## 2012-05-27 NOTE — Progress Notes (Signed)
Patient ID: Tammy Green, female   DOB: 06/10/1975, 37 y.o.   MRN: [id number] Pt was begun on pitocin after her epidural. She reached full dilatation and has begun to push.
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
## 2012-05-27 NOTE — Anesthesia Preprocedure Evaluation (Signed)
Anesthesia Evaluation  Patient identified by MRN, date of birth, ID band Patient awake    Reviewed: Allergy & Precautions, H&P , Patient's Chart, lab work & pertinent test results  Airway Mallampati: II TM Distance: >3 FB Neck ROM: full    Dental No notable dental hx.    Pulmonary neg pulmonary ROS,  breath sounds clear to auscultation  Pulmonary exam normal       Cardiovascular negative cardio ROS  Rhythm:regular Rate:Normal     Neuro/Psych negative neurological ROS  negative psych ROS   GI/Hepatic negative GI ROS, Neg liver ROS,   Endo/Other  negative endocrine ROS  Renal/GU Renal diseasenegative Renal ROS     Musculoskeletal   Abdominal   Peds  Hematology negative hematology ROS (+)   Anesthesia Other Findings Anxiety     Depression        Kidney stones 2013    Reproductive/Obstetrics (+) Pregnancy                           Anesthesia Physical Anesthesia Plan  ASA: II  Anesthesia Plan: Epidural   Post-op Pain Management:    Induction:   Airway Management Planned:   Additional Equipment:   Intra-op Plan:   Post-operative Plan:   Informed Consent: I have reviewed the patients History and Physical, chart, labs and discussed the procedure including the risks, benefits and alternatives for the proposed anesthesia with the patient or authorized representative who has indicated his/her understanding and acceptance.     Plan Discussed with:   Anesthesia Plan Comments:         Anesthesia Quick Evaluation

## 2012-05-27 NOTE — Progress Notes (Signed)
Patient ID: Tammy Green, female   DOB: [birth year], 37 y.o.   MRN: [id number] Pt was uncomfortable so she received an epidural. Her contractions are not tracing well. The cervix is 4 cm 80-90 % effaced and the vertex is at - 2 station. I have notified the NICU that the pt has preterm PROM and is receiving penicillin for unknown GBS status.

## 2012-05-28 LAB — CBC
HCT: 30.8 % — ABNORMAL LOW (ref 36.0–46.0)
Hemoglobin: 10.6 g/dL — ABNORMAL LOW (ref 12.0–15.0)
MCHC: 34.4 g/dL (ref 30.0–36.0)
MCV: 91.1 fL (ref 78.0–100.0)

## 2012-05-28 NOTE — Progress Notes (Signed)
Patient ID: [name], female   DOB: 29-Jul-1975, 37 y.o.   MRN: [id number] #1 afebrile BP normal HGB 12.4 to 10.6 no complaints.

## 2012-05-28 NOTE — Anesthesia Postprocedure Evaluation (Signed)
Anesthesia Post Note  Patient: Tammy Green  Procedure(s) Performed: * No procedures listed *  Anesthesia type: Epidural  Patient location: Mother/Baby  Post pain: Pain level controlled  Post assessment: Post-op Vital signs reviewed  Last Vitals:  Filed Vitals:   05/28/12 0526  BP: 96/60  Pulse: 72  Temp: 36.5 C  Resp: 18    Post vital signs: Reviewed  Level of consciousness:alert  Complications: No apparent anesthesia complications

## 2012-05-29 MED ORDER — OXYCODONE-ACETAMINOPHEN 5-325 MG PO TABS: 1.0000 | ORAL_TABLET | Freq: Four times a day (QID) | ORAL | Status: DC | PRN

## 2012-05-29 MED ORDER — IBUPROFEN 600 MG PO TABS: 600.0000 mg | ORAL_TABLET | Freq: Four times a day (QID) | ORAL | Status: DC | PRN

## 2012-05-29 NOTE — Progress Notes (Signed)
Ur chart review completed.  

## 2012-05-29 NOTE — Discharge Summary (Signed)
NAMEEVALYSE, STROOPE               ACCOUNT NO.:  [id number]  MEDICAL RECORD NO.:  [id number]  LOCATION:  9130                          FACILITY:  [facility]  PHYSICIAN:  Malachi Pro. Ambrose Mantle, M.D. DATE OF BIRTH:  [birth year]  DATE OF ADMISSION:  05/27/2012 DATE OF DISCHARGE:  05/29/2012                              DISCHARGE SUMMARY   HISTORY OF PRESENT ILLNESS:  A 37 year old white female, para 2-0-9-2, gravida 12.  [facility] June 26, 2012, admitted after spontaneous rupture of membranes at home early in the morning of the day of admission.  Blood group and type A positive, negative antibody.  Pap smear normal. Rubella immune.  RPR nonreactive.  Urine culture negative.  Hepatitis B surface antigen negative, HIV negative, GC and Chlamydia negative. Cystic fibrosis negative.  Harmony screen negative.  Quad screen negative.  One hour Glucola 131 group B strep, pending ultimately it came back negative, but the patient was treated as if it was positive since she was less than 37 weeks with an unknown status.  Ultrasound had suggested that the patient had placenta previa.  However, on admission to the hospital, the ultrasound showed no evidence of placenta previa. On admission, the cervix was 2-3 cm 50% to 60%, vertex at -2 to -3 station.  The patient was uncomfortable, so she received an epidural. By 11:51 a.m. she was 4 cm dilated, 90%, vertex at -2.  The patient was begun on Pitocin, after receiving her epidural she reached full dilatation, and began to push.  Initially, there was no descent, so the patient took a break and then resumed pushing made excellent progress when the baby crowned, the patient went out of control and began screaming to pull the baby out.  I informed her to push the baby out since the risk of vacuum has increased with prematurity.  She was able to deliver a living female infant ROA over an intact perineum.  Apgar were 8 and 9 at 1 and 5 minutes.  Placenta was removed intact.   Uterus normal.  There were no lacerations, but the hymen was very irregular presumably from prior tires.  Blood loss about 400 mL.  Postpartum, the patient did well.  She was discharged on the second postpartum day, the lactation consultants felt like she had an unusual discharge from her nipple, greenish in color and were cultured, so the culture was done twice.  It is pending at the present time.  The patient's laboratory data showed hemoglobin on admission on 12.4, hematocrit 36.4, white count 13,800, platelet count 251,000.  RPR nonreactive.  Follow up hemoglobin 10.6.  FINAL DIAGNOSES:  Intrauterine pregnancy at 35 weeks and 6 days, delivered vertex, preterm, premature rupture of the membranes.  OPERATION:  Spontaneous delivery vertex.  FINAL CONDITION:  Improved.  INSTRUCTIONS:  Include our regular discharge instruction booklet as well as the after visit summary, prescription for Percocet 5/325 #30 tablets one every 6 hours as needed for pain and Motrin 600 mg #30 tablets one every 6 hours as needed for pain.  She is also asked to take some ferrous sulfate as well as prenatal vitamins.  Return to the office in 6 weeks for followup examination.  She can discuss her sterilization procedure with Dr. Jackelyn Knife.     Malachi Pro. Ambrose Mantle, M.D.     TFH/MEDQ  D:  05/29/2012  T:  05/29/2012  Job:  [id number]

## 2012-05-29 NOTE — Discharge Summary (Signed)
#  2 afebrile material from breast sent for culture

## 2012-05-29 NOTE — Progress Notes (Signed)
Pt discharged before CSW could assess history of depression/anxiety.   

## 2012-05-31 LAB — BODY FLUID CULTURE: Special Requests: NORMAL

## 2012-06-01 ENCOUNTER — Ambulatory Visit (HOSPITAL_COMMUNITY): Payer: Medicaid Other

## 2012-06-01 LAB — BODY FLUID CULTURE: Gram Stain: NONE SEEN

## 2012-06-02 ENCOUNTER — Ambulatory Visit (HOSPITAL_COMMUNITY)
Admission: RE | Admit: 2012-06-02 | Discharge: 2012-06-02 | Disposition: A | Discharge status: Home / Self Care | Payer: Medicaid Other | Source: Ambulatory Visit | Attending: Obstetrics and Gynecology | Admitting: Obstetrics and Gynecology

## 2012-06-09 ENCOUNTER — Inpatient Hospital Stay (HOSPITAL_COMMUNITY): Admission: RE | Admit: 2012-06-09 | Payer: Medicaid Other | Source: Ambulatory Visit

## 2012-07-05 ENCOUNTER — Inpatient Hospital Stay (HOSPITAL_COMMUNITY)
Admission: AD | Admit: 2012-07-05 | Discharge: 2012-07-05 | Disposition: A | Discharge status: Home / Self Care | Payer: Medicaid Other | Source: Ambulatory Visit | Attending: Obstetrics and Gynecology | Admitting: Obstetrics and Gynecology

## 2012-07-05 ENCOUNTER — Encounter (HOSPITAL_COMMUNITY): Payer: Self-pay | Admitting: *Deleted

## 2012-07-05 DIAGNOSIS — F3289 Other specified depressive episodes: Secondary | ICD-10-CM | POA: Insufficient documentation

## 2012-07-05 DIAGNOSIS — O878 Other venous complications in the puerperium: Secondary | ICD-10-CM | POA: Insufficient documentation

## 2012-07-05 DIAGNOSIS — K649 Unspecified hemorrhoids: Secondary | ICD-10-CM | POA: Insufficient documentation

## 2012-07-05 DIAGNOSIS — O99345 Other mental disorders complicating the puerperium: Secondary | ICD-10-CM | POA: Insufficient documentation

## 2012-07-05 DIAGNOSIS — F329 Major depressive disorder, single episode, unspecified: Secondary | ICD-10-CM | POA: Insufficient documentation

## 2012-07-05 LAB — CBC
Hemoglobin: 13.9 g/dL (ref 12.0–15.0)
MCH: 30.4 pg (ref 26.0–34.0)
MCV: 89.9 fL (ref 78.0–100.0)
RBC: 4.57 MIL/uL (ref 3.87–5.11)

## 2012-07-05 MED ORDER — PAROXETINE HCL 10 MG PO TABS: 5.0000 mg | ORAL_TABLET | ORAL | Status: DC

## 2012-07-05 MED ORDER — HYDROCORTISONE ACE-PRAMOXINE 1-1 % RE FOAM: 1.0000 | Freq: Two times a day (BID) | RECTAL | Status: DC

## 2012-07-05 NOTE — MAU Note (Signed)
Pt sttes she has been on meds in the past for anxiety and depression-sttes she thinks she is ready to go back on depression meds-states,"I have the baby blues"-note the pt is teary eyed-she is appropriate and is a good historian.

## 2012-07-05 NOTE — MAU Note (Signed)
Pt reports she has had small amount of vaginal bleeding since vaginal delivery on 02/01, states she started bleeding heavily on 03/08. Today has changed a pad q 2 hours. Also states she has rectal bleeding since Saturday.

## 2012-07-05 NOTE — MAU Provider Note (Signed)
Chief Complaint: Vaginal Bleeding   First Provider Initiated Contact with Patient 07/05/12 1943      SUBJECTIVE HPI: Tammy Green is a 37 y.o. [id number] at 6 weeks post partum who presents with: 1. Bleeding heavily since 3/08, soaking a pad Q2 hours.  2. Bleeding painful hemorrhoids, worse over past week. 3. Baby blues.  Normal lochia, stopped ~ 1 weeks ago x a few days. Breastfed x 3-4 weeks. Stopped 2 weeks ago due to latch problems and poor supply. Denies SI/HI. States she is not a danger to her baby. No problems at home. Doesn't know whay she is so tearful. Has been on Paxyl and Xanax outside of pregnancy. Has appt at [facility] in a few weeks. Would like to start back on meds.    Past Medical History  Diagnosis Date  . Anxiety   . Depression   . Kidney stones 2013   OB History   Grav Para Term Preterm Abortions TAB SAB Ect Mult Living   12 3 1 1 9 2 7   3      # Outc Date GA Lbr Len/2nd Wgt Sex Del Anes PTL Lv   1 PRE 2/14 [redacted]w[redacted]d 00:00 / 01:10 2.89kg(6lb5.9oz) F SVD EPI  Yes   2 TAB            3 SAB            4 PAR     M SVD   Yes   5 SAB            6 SAB            7 SAB            8 TRM     M SVD   Yes   9 SAB            10 SAB            11 SAB            12 TAB              Past Surgical History  Procedure Laterality Date  . Femur fracture surgery    . Wisdom tooth extraction     History   Social History  . Marital Status: Single    Spouse Name: N/A    Number of Children: N/A  . Years of Education: N/A   Occupational History  . Not on file.   Social History Main Topics  . Smoking status: Former Smoker -- 0.25 packs/day for 18 years  . Smokeless tobacco: Former Neurosurgeon    Quit date: 11/15/2011  . Alcohol Use: No  . Drug Use: No  . Sexually Active: Not Currently    Birth Control/ Protection: None     Comment: not since she conceived   Other Topics Concern  . Not on file   Social History Narrative  . No narrative on file   No current  facility-administered medications on file prior to encounter.   Current Outpatient Prescriptions on File Prior to Encounter  Medication Sig Dispense Refill  . ibuprofen (ADVIL,MOTRIN) 600 MG tablet Take 1 tablet (600 mg total) by mouth every 6 (six) hours as needed for pain.  30 tablet  0  . oxyCODONE-acetaminophen (PERCOCET/ROXICET) 5-325 MG per tablet Take 1 tablet by mouth every 6 (six) hours as needed.  30 tablet  0  . Prenatal Vit-Fe Fumarate-FA (PRENATAL MULTIVITAMIN) TABS Take 1 tablet by mouth daily.      Marland Kitchen  Tamsulosin HCl (FLOMAX) 0.4 MG CAPS Take 0.4 mg by mouth daily as needed. For kidney stones       Allergies  Allergen Reactions  . Axid (Nizatidine) Hives  . Codeine Hives    Pt states she can take Percocet but can not take Vicodin    ROS: Pertinent items in HPI  OBJECTIVE Blood pressure 112/67, pulse 85, temperature 98.3 F (36.8 C), temperature source Oral, resp. rate 18, height 5' 6.5" (1.689 m), weight 86.637 kg (191 lb), SpO2 99.00%. GENERAL: Well-developed, well-nourished female in moderate emotional distress, tearful throughout visit.  HEENT: Normocephalic HEART: normal rate RESP: normal effort ABDOMEN: Soft, non-tender EXTREMITIES: Nontender, no edema NEURO: Alert and oriented SPECULUM EXAM: NEFG, moderate amount of bright red blood noted, cervix clean BIMANUAL: cervix closed; uterus normal size, no adnexal tenderness or masses  LAB RESULTS Results for orders placed during the hospital encounter of 07/05/12 (from the past 24 hour(s))  CBC     Status: Abnormal   Collection Time    07/05/12  7:40 PM      Result Value Range   WBC 12.1 (*) 4.0 - 10.5 K/uL   RBC 4.57  3.87 - 5.11 MIL/uL   Hemoglobin 13.9  12.0 - 15.0 g/dL   HCT 16.1  09.6 - 04.5 %   MCV 89.9  78.0 - 100.0 fL   MCH 30.4  26.0 - 34.0 pg   MCHC 33.8  30.0 - 36.0 g/dL   RDW 40.9  81.1 - 91.4 %   Platelets 305  150 - 400 K/uL    IMAGING No results found.  MAU COURSE  ASSESSMENT 1.  Menorrhagia   2. Postpartum depression, postpartum condition   3. Hemorrhoids in pregnancy and puerperium, delivered     PLAN Discharge home. Dr. Ambrose Mantle will not manage your mental health medications. F/U w/ [facility]. Bleeding precautions. Support given.     Follow-up Information   Follow up with Bing Plume, MD. (as scheduled or  as needed if symptoms worsen)    Contact information:   [address] [place] [address] [phone]       Follow up with [facility]. (As needed if symptoms worsen)    Contact information:   [address] [place] [address] [phone]      Follow up with [facility] (for severe depression)    Contact information:   [address] [place] [id number] [place] [address] [phone]       Medication List    TAKE these medications       hydrocortisone-pramoxine rectal foam  Commonly known as:  PROCTOFOAM HC  Place 1 applicator rectally 2 (two) times daily.     ibuprofen 600 MG tablet  Commonly known as:  ADVIL,MOTRIN  Take 1 tablet (600 mg total) by mouth every 6 (six) hours as needed for pain.     oxyCODONE-acetaminophen 5-325 MG per tablet  Commonly known as:  PERCOCET/ROXICET  Take 1 tablet by mouth every 6 (six) hours as needed.     PARoxetine 10 MG tablet  Commonly known as:  PAXIL  Take 0.5-1 tablets (5-10 mg total) by mouth every morning.     phenylephrine-shark liver oil-mineral oil-petrolatum 0.25-3-14-71.9 % rectal ointment  Commonly known as:  PREPARATION H  Place 1 application rectally 4 (four) times daily as needed for hemorrhoids.     prenatal multivitamin Tabs  Take 1 tablet by mouth daily.  tamsulosin 0.4 MG Caps  Commonly known as:  FLOMAX  Take 0.4 mg by mouth daily as needed. For kidney stones       [place] 07/05/2012  9:00 PM

## 2012-07-21 ENCOUNTER — Encounter (HOSPITAL_COMMUNITY): Admission: RE | Payer: Self-pay | Source: Ambulatory Visit

## 2012-07-21 ENCOUNTER — Ambulatory Visit (HOSPITAL_COMMUNITY): Admission: RE | Admit: 2012-07-21 | Payer: 59 | Source: Ambulatory Visit | Admitting: Obstetrics and Gynecology

## 2012-07-21 SURGERY — LIGATION, FALLOPIAN TUBE, LAPAROSCOPIC
Anesthesia: General | Laterality: Bilateral

## 2013-02-05 ENCOUNTER — Encounter (HOSPITAL_COMMUNITY): Payer: Self-pay | Admitting: Emergency Medicine

## 2013-02-05 ENCOUNTER — Emergency Department (INDEPENDENT_AMBULATORY_CARE_PROVIDER_SITE_OTHER)
Admission: EM | Admit: 2013-02-05 | Discharge: 2013-02-05 | Disposition: A | Discharge status: Home / Self Care | Payer: Medicaid Other | Source: Home / Self Care | Attending: Family Medicine | Admitting: Family Medicine

## 2013-02-05 DIAGNOSIS — H6123 Impacted cerumen, bilateral: Secondary | ICD-10-CM

## 2013-02-05 DIAGNOSIS — H6120 Impacted cerumen, unspecified ear: Secondary | ICD-10-CM

## 2013-02-05 NOTE — ED Provider Notes (Signed)
CSN: [id number]     Arrival date & time 02/05/13  1252 History   None    Chief Complaint  Patient presents with  . Otalgia   (Consider location/radiation/quality/duration/timing/severity/associated sxs/prior Treatment) Patient is a 37 y.o. female presenting with ear pain. The history is provided by the patient.  Otalgia Location:  Bilateral Behind ear:  No abnormality Quality:  Sore and dull Severity:  Mild Onset quality:  Gradual Duration:  1 month Progression:  Worsening Chronicity:  New Context comment:  Using Q-tips, pain from left to right ears, now bilat. Associated symptoms: hearing loss   Associated symptoms: no ear discharge, no fever and no tinnitus     Past Medical History  Diagnosis Date  . Anxiety   . Depression   . Kidney stones 2013   Past Surgical History  Procedure Laterality Date  . Femur fracture surgery    . Wisdom tooth extraction     Family History  Problem Relation Age of Onset  . Bipolar disorder Mother   . Bipolar disorder Sister   . Bipolar disorder Brother   . Diabetes Maternal Grandmother   . Cancer Maternal Grandfather    History  Substance Use Topics  . Smoking status: Former Smoker -- 0.25 packs/day for 18 years  . Smokeless tobacco: Former Neurosurgeon    Quit date: 11/15/2011  . Alcohol Use: No   OB History   Grav Para Term Preterm Abortions TAB SAB Ect Mult Living   12 3 1 1 9 2 7   3      Review of Systems  Constitutional: Negative.  Negative for fever.  HENT: Positive for ear pain and hearing loss. Negative for ear discharge and tinnitus.     Allergies  Axid and Codeine  Home Medications   Current Outpatient Rx  Name  Route  Sig  Dispense  Refill  . ALPRAZolam (XANAX XR PO)   Oral   Take by mouth.         Marland Kitchen PARoxetine (PAXIL) 10 MG tablet   Oral   Take 0.5-1 tablets (5-10 mg total) by mouth every morning.   30 tablet   0   . hydrocortisone-pramoxine (PROCTOFOAM HC) rectal foam   Rectal   Place 1 applicator  rectally 2 (two) times daily.   10 g   2   . ibuprofen (ADVIL,MOTRIN) 600 MG tablet   Oral   Take 1 tablet (600 mg total) by mouth every 6 (six) hours as needed for pain.   30 tablet   0   . oxyCODONE-acetaminophen (PERCOCET/ROXICET) 5-325 MG per tablet   Oral   Take 1 tablet by mouth every 6 (six) hours as needed.   30 tablet   0   . phenylephrine-shark liver oil-mineral oil-petrolatum (PREPARATION H) 0.25-3-14-71.9 % rectal ointment   Rectal   Place 1 application rectally 4 (four) times daily as needed for hemorrhoids.         . Prenatal Vit-Fe Fumarate-FA (PRENATAL MULTIVITAMIN) TABS   Oral   Take 1 tablet by mouth daily.         . Tamsulosin HCl (FLOMAX) 0.4 MG CAPS   Oral   Take 0.4 mg by mouth daily as needed. For kidney stones          BP 115/67  Pulse 61  Temp(Src) 97.9 F (36.6 C) (Oral)  Resp 16  SpO2 99% Physical Exam  Nursing note and vitals reviewed. Constitutional: She appears well-developed and well-nourished.  HENT:  Head: Normocephalic.  Right Ear: No drainage, swelling or tenderness. Decreased hearing is noted.  Left Ear: No drainage, swelling or tenderness. Decreased hearing is noted.  Ears:  Nose: Nose normal.  Mouth/Throat: Oropharynx is clear and moist.    ED Course  Procedures (including critical care time) Labs Review Labs Reviewed - No data to display Imaging Review No results found.    MDM  Pt intolerant of ear irrigation on right. Will treat with debrox and return for re-irrig.    Linna Hoff, MD 02/05/13 [phone]

## 2014-02-25 ENCOUNTER — Encounter (HOSPITAL_COMMUNITY): Payer: Self-pay | Admitting: Emergency Medicine

## 2014-05-29 ENCOUNTER — Ambulatory Visit: Payer: Self-pay | Admitting: Family Medicine

## 2014-06-13 ENCOUNTER — Encounter: Payer: Self-pay | Admitting: Family Medicine

## 2014-06-13 ENCOUNTER — Ambulatory Visit (INDEPENDENT_AMBULATORY_CARE_PROVIDER_SITE_OTHER): Payer: 59 | Admitting: Family Medicine

## 2014-06-13 VITALS — BP 97/65 | HR 67 | Temp 98.0°F | Resp 16 | Ht 66.0 in | Wt 193.0 lb

## 2014-06-13 DIAGNOSIS — K648 Other hemorrhoids: Secondary | ICD-10-CM

## 2014-06-13 DIAGNOSIS — R2232 Localized swelling, mass and lump, left upper limb: Secondary | ICD-10-CM

## 2014-06-13 DIAGNOSIS — Z8659 Personal history of other mental and behavioral disorders: Secondary | ICD-10-CM

## 2014-06-13 DIAGNOSIS — Z9189 Other specified personal risk factors, not elsewhere classified: Secondary | ICD-10-CM

## 2014-06-13 DIAGNOSIS — E049 Nontoxic goiter, unspecified: Secondary | ICD-10-CM

## 2014-06-13 DIAGNOSIS — Z13220 Encounter for screening for lipoid disorders: Secondary | ICD-10-CM

## 2014-06-13 DIAGNOSIS — Z23 Encounter for immunization: Secondary | ICD-10-CM

## 2014-06-13 DIAGNOSIS — H6123 Impacted cerumen, bilateral: Secondary | ICD-10-CM

## 2014-06-13 LAB — THYROID PANEL WITH TSH
Free Thyroxine Index: 0.2 — ABNORMAL LOW (ref 1.4–3.8)
T3 Uptake: 22 % (ref 22–35)
T4 TOTAL: 0.9 ug/dL — AB (ref 4.5–12.0)
TSH: 93.289 u[IU]/mL — AB (ref 0.350–4.500)

## 2014-06-13 LAB — CBC WITH DIFFERENTIAL/PLATELET
BASOS ABS: 0 10*3/uL (ref 0.0–0.1)
BASOS PCT: 0 % (ref 0–1)
EOS PCT: 1 % (ref 0–5)
Eosinophils Absolute: 0.1 10*3/uL (ref 0.0–0.7)
HEMATOCRIT: 39.2 % (ref 36.0–46.0)
Hemoglobin: 13.3 g/dL (ref 12.0–15.0)
Lymphocytes Relative: 19 % (ref 12–46)
Lymphs Abs: 1.5 10*3/uL (ref 0.7–4.0)
MCH: 31.4 pg (ref 26.0–34.0)
MCHC: 33.9 g/dL (ref 30.0–36.0)
MCV: 92.5 fL (ref 78.0–100.0)
MONO ABS: 0.5 10*3/uL (ref 0.1–1.0)
MONOS PCT: 6 % (ref 3–12)
MPV: 9 fL (ref 8.6–12.4)
Neutro Abs: 6 10*3/uL (ref 1.7–7.7)
Neutrophils Relative %: 74 % (ref 43–77)
PLATELETS: 300 10*3/uL (ref 150–400)
RBC: 4.24 MIL/uL (ref 3.87–5.11)
RDW: 13.5 % (ref 11.5–15.5)
WBC: 8.1 10*3/uL (ref 4.0–10.5)

## 2014-06-13 LAB — COMPLETE METABOLIC PANEL WITH GFR
ALBUMIN: 3.7 g/dL (ref 3.5–5.2)
ALT: 16 U/L (ref 0–35)
AST: 20 U/L (ref 0–37)
Alkaline Phosphatase: 28 U/L — ABNORMAL LOW (ref 39–117)
BUN: 11 mg/dL (ref 6–23)
CALCIUM: 8.1 mg/dL — AB (ref 8.4–10.5)
CO2: 26 mEq/L (ref 19–32)
Chloride: 104 mEq/L (ref 96–112)
Creat: 0.63 mg/dL (ref 0.50–1.10)
GFR, Est African American: >89 mL/min
GFR, Est Non African American: >89 mL/min
GLUCOSE: 89 mg/dL (ref 70–99)
Potassium: 4.2 mEq/L (ref 3.5–5.3)
SODIUM: 134 meq/L — AB (ref 135–145)
Total Bilirubin: 0.4 mg/dL (ref 0.2–1.2)
Total Protein: 6.7 g/dL (ref 6.0–8.3)

## 2014-06-13 LAB — LIPID PANEL
CHOLESTEROL: 165 mg/dL (ref 0–200)
HDL: 60 mg/dL (ref >39)
LDL Cholesterol: 90 mg/dL (ref 0–99)
Total CHOL/HDL Ratio: 2.8 Ratio
Triglycerides: 74 mg/dL (ref <150)
VLDL: 15 mg/dL (ref 0–40)

## 2014-06-13 MED ORDER — ALPRAZOLAM 0.25 MG PO TABS: 0.2500 mg | ORAL_TABLET | Freq: Two times a day (BID) | ORAL | Status: DC | PRN

## 2014-06-13 MED ORDER — ANTIPYRINE-BENZOCAINE 5.4-1.4 % OT SOLN: 3.0000 [drp] | Freq: Three times a day (TID) | OTIC | Status: DC

## 2014-06-13 NOTE — Patient Instructions (Signed)
You will be contacted about your lab results by Monday. I will decide when you need to return after I review your labs. If there are any significant abnormalities, I will be talking with your.  I have ordered referrals to the specialists who need to address hemorrhoids and the lump on your arm.

## 2014-06-14 LAB — VITAMIN D 25 HYDROXY (VIT D DEFICIENCY, FRACTURES): Vit D, 25-Hydroxy: 25 ng/mL — ABNORMAL LOW (ref 30–100)

## 2014-06-15 ENCOUNTER — Telehealth: Payer: Self-pay | Admitting: Family Medicine

## 2014-06-15 MED ORDER — LEVOTHYROXINE SODIUM 125 MCG PO TABS: 125.0000 ug | ORAL_TABLET | Freq: Every day | ORAL | Status: DC

## 2014-06-15 NOTE — Telephone Encounter (Signed)
I phoned and left pt a brief message- Pt has severe thyroid deficiency. I have prescribed Levothyroxine 125 mcg and routed the med to her pharmacy. She needs to take 1 tablet every morning by itself, 30 minutes before any other food or drink. Thyroid labs need to be repeated in 6-8 weeks. Please schedule pt for follow-up with me in 6-8 weeks. Thank you.

## 2014-06-15 NOTE — Progress Notes (Signed)
Quick Note:  Please send pt copy of labs...  I called and left a message about thyroid labs; our clinical message staff has been instructed to contact you also. You have hypothyroidism; you will need to take medication to supplement what your thyroid gland does not make. I have prescribed medication to be taken daily- Levothyroxine 125 mcg 1 tab every morning by itself (this is very important for proper absorption). The medication is at your pharmacy. Your Vitamin D level is a little below normal. An over the counter Vitamin D supplement 2000 units daily can increase this into normal range (~ 50).  Blood counts are normal; you are not anemic. Lipid panel is normal. Metabolic panel is normal (salts, blood sugar, kidney and liver function tests).  Contact the clinic if you have questions or concerns.  ______

## 2014-06-17 ENCOUNTER — Ambulatory Visit (INDEPENDENT_AMBULATORY_CARE_PROVIDER_SITE_OTHER): Payer: 59 | Admitting: Family Medicine

## 2014-06-17 DIAGNOSIS — H6123 Impacted cerumen, bilateral: Secondary | ICD-10-CM

## 2014-06-17 MED ORDER — LEVOTHYROXINE SODIUM 125 MCG PO TABS: 125.0000 ug | ORAL_TABLET | Freq: Every day | ORAL | Status: DC

## 2014-06-17 NOTE — Telephone Encounter (Signed)
Pt called, she wanted her Rx sent to [facility] on [address]. I re-sent her Rx.

## 2014-06-17 NOTE — Progress Notes (Signed)
[facility] [address], [place] [id number] [phone]  Date:  06/17/2014   Name:  Tammy GlassmanSarah E Green   DOB:  1975-12-11   MRN:  [id number]  PCP:  No PCP Per Patient    Chief Complaint: No chief complaint on file.   History of Present Illness:  Tammy GlassmanSarah E Green is a 39 y.o. very pleasant female patient who presents with the following:  Here today to have earwax removed. A cerumen impaction was diagnosed elsewhere and she would like to get this taken care of- it is affecting her hearing and causing problems at home and work.  She has noted this issue for several months   Patient Active Problem List   Diagnosis Date Noted  . Kidney stones 03/30/2012    Past Medical History  Diagnosis Date  . Anxiety   . Depression   . Kidney stones 2013    Past Surgical History  Procedure Laterality Date  . Femur fracture surgery    . Wisdom tooth extraction      History  Substance Use Topics  . Smoking status: Former Smoker -- 0.25 packs/day for 18 years  . Smokeless tobacco: Former NeurosurgeonUser    Quit date: 11/15/2011  . Alcohol Use: No    Family History  Problem Relation Age of Onset  . Bipolar disorder Mother   . Bipolar disorder Sister   . Bipolar disorder Brother   . Diabetes Maternal Grandmother   . Cancer Maternal Grandfather     Allergies  Allergen Reactions  . Axid [Nizatidine] Hives  . Codeine Hives    Pt states she can take Percocet but can not take Vicodin    Medication list has been reviewed and updated.  Current Outpatient Prescriptions on File Prior to Visit  Medication Sig Dispense Refill  . ALPRAZolam (XANAX) 0.25 MG tablet Take 1 tablet (0.25 mg total) by mouth 2 (two) times daily as needed for anxiety. 30 tablet 0  . antipyrine-benzocaine (AURALGAN) otic solution Place 3-4 drops into both ears 3 (three) times daily. 10 mL 0  . ibuprofen (ADVIL,MOTRIN) 600 MG tablet Take 1 tablet (600 mg total) by mouth every 6 (six) hours as needed  for pain. (Patient not taking: Reported on 06/13/2014) 30 tablet 0  . levothyroxine (SYNTHROID, LEVOTHROID) 125 MCG tablet Take 1 tablet (125 mcg total) by mouth daily. 30 tablet 3  . Prenatal Vit-Fe Fumarate-FA (PRENATAL MULTIVITAMIN) TABS Take 1 tablet by mouth daily.     No current facility-administered medications on file prior to visit.    Review of Systems:  As per HPI- otherwise negative.   Physical Examination: There were no vitals filed for this visit. There were no vitals filed for this visit. There is no weight on file to calculate BMI. Ideal Body Weight:     GEN: WDWN, NAD, Non-toxic, Alert & Oriented x 3 HEENT: Atraumatic, Normocephalic.  Ears and Nose: No external deformity. EXTR: No clubbing/cyanosis/edema NEURO: Normal gait.  PSYCH: Normally interactive. Conversant. Not depressed or anxious appearing.  Calm demeanor.  Both ears with cerumen impaction. Ears treated with colace and irrigated but could not remove cerumen.  I personally used alligator forceps to remove a large amount of impacted cerumen and dead skin from both ears.  She felt much better  Assessment and Plan: Cerumen impaction, bilateral - Plan: Ear Lavage  Treated as above.  I personally removed cerumen from both of her ears   Signed Abbe AmsterdamJessica Matei Magnone, MD

## 2014-06-18 DIAGNOSIS — Z8659 Personal history of other mental and behavioral disorders: Secondary | ICD-10-CM | POA: Insufficient documentation

## 2014-06-18 DIAGNOSIS — Z9189 Other specified personal risk factors, not elsewhere classified: Secondary | ICD-10-CM | POA: Insufficient documentation

## 2014-06-18 DIAGNOSIS — R2230 Localized swelling, mass and lump, unspecified upper limb: Secondary | ICD-10-CM | POA: Insufficient documentation

## 2014-06-18 NOTE — Progress Notes (Signed)
Subjective:    Patient ID: Tammy Green, female    DOB: 10/27/1975, 39 y.o.   MRN: [id number]  HPI  This 39 y.o. Female is new to [facility]; she has been well except for some situational stress with family (teenage son has recently been admitted to a group home). She does have a hx of depression and anxiety; Alprazolam have been effective for prn use. Pt c/o hearing difficulties which are negatively impacting her at work; she works in a Secretary/administrator atmosphere.  Other concerns: Lump under skin on upper extremity- present for years but seems to be getting larger; ti is not painful.  Hemorrhoids- Known to have internal hemorrhoids that occasionally bleed.  Past Surgical History  Procedure Laterality Date  . Femur fracture surgery    . Wisdom tooth extraction      History   Social History  . Marital Status: Single    Spouse Name: N/A  . Number of Children: N/A  . Years of Education: N/A   Occupational History  . Not on file.   Social History Main Topics  . Smoking status: Former Smoker -- 0.25 packs/day for 18 years  . Smokeless tobacco: Former Neurosurgeon    Quit date: 11/15/2011  . Alcohol Use: No  . Drug Use: No  . Sexual Activity: Not Currently    Birth Control/ Protection: None     Comment: not since she conceived   Other Topics Concern  . Not on file   Social History Narrative    Family History  Problem Relation Age of Onset  . Bipolar disorder Mother   . Bipolar disorder Sister   . Bipolar disorder Brother   . Diabetes Maternal Grandmother   . Cancer Maternal Grandfather     Review of Systems  Constitutional: Positive for fatigue. Negative for fever, activity change, appetite change and unexpected weight change.  HENT: Negative.   Eyes: Negative.   Respiratory: Negative.   Cardiovascular: Negative.   Gastrointestinal: Positive for constipation and anal bleeding. Negative for abdominal pain, blood in stool and rectal pain.  Endocrine: Negative.     Genitourinary: Negative.   Musculoskeletal: Negative.   Neurological: Negative.   Psychiatric/Behavioral: Positive for dysphoric mood. Negative for suicidal ideas, confusion, self-injury and agitation. The patient is nervous/anxious.        Objective:   Physical Exam  Constitutional: She is oriented to person, place, and time. Vital signs are normal. She appears well-developed and well-nourished. No distress.  Blood pressure 97/65, pulse 67, temperature 98 F (36.7 C), temperature source Oral, resp. rate 16, height  (1.676 m), weight 193 lb (87.544 kg), last menstrual period 06/06/2014, SpO2 98 %.   HENT:  Head: Normocephalic and atraumatic.  Right Ear: Decreased hearing is noted.  Left Ear: Decreased hearing is noted.  Bilateral cerumen impactions.  Eyes: Conjunctivae, EOM and lids are normal. Pupils are equal, round, and reactive to light. No scleral icterus.  Neck: Trachea normal, normal range of motion, full passive range of motion without pain and phonation normal. Neck supple. No spinous process tenderness and no muscular tenderness present. Thyromegaly present. No thyroid mass present.  Cardiovascular: Normal rate, regular rhythm, S1 normal, S2 normal, normal heart sounds and normal pulses.   No extrasystoles are present. PMI is not displaced.  Exam reveals no gallop and no friction rub.   No murmur heard. Pulmonary/Chest: Effort normal and breath sounds normal. No respiratory distress. She has no decreased breath sounds. She has no wheezes.  Abdominal: Soft. Normal appearance and bowel sounds are normal. She exhibits no distension and no mass. There is no hepatosplenomegaly. There is no tenderness. There is no guarding and no CVA tenderness.  Musculoskeletal:       Cervical back: Normal.       Thoracic back: Normal.       Lumbar back: Normal.  Remainder of exam unremarkable.  Lymphadenopathy:       Head (right side): No submental, no submandibular, no tonsillar, no  preauricular, no posterior auricular and no occipital adenopathy present.       Head (left side): No submental, no submandibular, no tonsillar, no preauricular, no posterior auricular and no occipital adenopathy present.    She has no cervical adenopathy.       Right: No supraclavicular adenopathy present.       Left: No supraclavicular adenopathy present.  Neurological: She is alert and oriented to person, place, and time. She has normal strength and normal reflexes. She displays no atrophy. No cranial nerve deficit or sensory deficit. She exhibits normal muscle tone. Coordination and gait normal.  Skin: Skin is warm, dry and intact. No ecchymosis, no lesion and no rash noted. She is not diaphoretic. No cyanosis. No pallor. Nails show no clubbing.  Psychiatric: Her speech is normal and behavior is normal. Judgment and thought content normal. Her mood appears anxious. Her affect is not blunt and not labile. Cognition and memory are normal. She does not exhibit a depressed mood.  Nursing note and vitals reviewed.      Assessment & Plan:  Cerumen impaction, bilateral  Lump of skin of upper extremity, left - Plan: CBC with Differential/Platelet, Ambulatory referral to General Surgery  Hemorrhoids, internal, with bleeding - Plan: CBC with Differential/Platelet, Ambulatory referral to Gastroenterology  Need for prophylactic vaccination and inoculation against influenza - Plan: Flu Vaccine QUAD 36+ mos IM  Enlarged thyroid gland - Plan: Thyroid Panel With TSH, Vitamin D, 25-hydroxy, COMPLETE METABOLIC PANEL WITH GFR  History of depression - Plan: Thyroid Panel With TSH, Vitamin D, 25-hydroxy  History of anxiety state  Screening for lipid disorders - Plan: Lipid panel   Meds ordered this encounter  Medications  . ALPRAZolam (XANAX) 0.25 MG tablet    Sig: Take 1 tablet (0.25 mg total) by mouth 2 (two) times daily as needed for anxiety.    Dispense:  30 tablet    Refill:  0  .  antipyrine-benzocaine (AURALGAN) otic solution    Sig: Place 3-4 drops into both ears 3 (three) times daily.    Dispense:  10 mL    Refill:  0

## 2014-06-18 NOTE — Telephone Encounter (Signed)
Scheduling: please call and schedule appt, recheck thyroid/labs.

## 2014-06-25 NOTE — Telephone Encounter (Signed)
Left message for patient to call back to schedule appointment with [name] in 6-8 weeks for a follow-up visit.

## 2014-07-03 ENCOUNTER — Telehealth: Payer: Self-pay

## 2014-07-03 MED ORDER — PAROXETINE HCL 20 MG PO TABS: 20.0000 mg | ORAL_TABLET | Freq: Every day | ORAL | Status: DC

## 2014-07-03 NOTE — Telephone Encounter (Signed)
PATIENT STATES SHE SAW DR. [name] IN FEB. SHE SAID DR. MCPHERSON ASKED HER IF SHE WOULD LIKE TO HAVE SOMETHING FOR HER ANXIETY AND DEPRESSION. AT THE TIME Tammy Green TOLD HER NO. BUT NOW SHE WOULD LIKE TO HAVE SOMETHING LIKE PAXIL. SHE SAID SHE WAS PRESCRIBED THIS ALONG TIME AGO AND IT REALLY HELPED. SHE THINKS SHE NEEDS SOMETHING LONG TERM. BEST PHONE [phone] (CELL)  PHARMACY CHOICE IS [facility] ON [address].  MBC

## 2014-07-03 NOTE — Telephone Encounter (Signed)
I prescribed Paxil 20 mg; pt should start with 1/2 tablet every morning for 2 weeks then increase to full tablet every morning. It may take 2-3 weeks before she notices improvement. Schedule follow-up if this medication does not help or if problems occur with this medication (even though pt has taken it before).

## 2014-07-04 NOTE — Telephone Encounter (Signed)
Pt notified and appt schedule

## 2014-07-10 ENCOUNTER — Encounter: Payer: Self-pay | Admitting: Gastroenterology

## 2014-08-01 ENCOUNTER — Ambulatory Visit: Payer: Self-pay | Admitting: Family Medicine

## 2014-08-05 ENCOUNTER — Ambulatory Visit: Payer: 59 | Admitting: Family Medicine

## 2014-08-07 ENCOUNTER — Ambulatory Visit (INDEPENDENT_AMBULATORY_CARE_PROVIDER_SITE_OTHER): Payer: 59 | Admitting: Family Medicine

## 2014-08-07 ENCOUNTER — Encounter: Payer: Self-pay | Admitting: Family Medicine

## 2014-08-07 VITALS — BP 108/71 | HR 78 | Temp 97.1°F | Resp 16 | Ht 66.0 in | Wt 188.8 lb

## 2014-08-07 DIAGNOSIS — E038 Other specified hypothyroidism: Secondary | ICD-10-CM

## 2014-08-07 DIAGNOSIS — E039 Hypothyroidism, unspecified: Secondary | ICD-10-CM | POA: Insufficient documentation

## 2014-08-07 DIAGNOSIS — F418 Other specified anxiety disorders: Secondary | ICD-10-CM | POA: Diagnosis not present

## 2014-08-07 DIAGNOSIS — E034 Atrophy of thyroid (acquired): Secondary | ICD-10-CM

## 2014-08-07 LAB — THYROID PANEL WITH TSH
Free Thyroxine Index: 2.7 (ref 1.4–3.8)
T3 UPTAKE: 29 % (ref 22–35)
T4 TOTAL: 9.3 ug/dL (ref 4.5–12.0)
TSH: 2.573 u[IU]/mL (ref 0.350–4.500)

## 2014-08-07 MED ORDER — L-METHYLFOLATE-B6-B12 3-35-2 MG PO TABS: 1.0000 | ORAL_TABLET | Freq: Every day | ORAL | Status: DC

## 2014-08-07 MED ORDER — PAROXETINE HCL 30 MG PO TABS
30.0000 mg | ORAL_TABLET | Freq: Every day | ORAL | Status: DC
Start: 2014-08-07 — End: 2014-12-17

## 2014-08-07 MED ORDER — ALPRAZOLAM 0.25 MG PO TABS: 0.2500 mg | ORAL_TABLET | Freq: Two times a day (BID) | ORAL | Status: DC | PRN

## 2014-08-07 NOTE — Patient Instructions (Signed)
I have prescribed a new B vitamin to be taken once a day; continue taking the other multivitamin that you are taking. Paxil dose has been increased to 30 mg daily. I will contact you with the results of today's thyroid results. Continue taking your current dose of thyroid medication until you have been advised otherwise. Iw ill make sure you have at least 3 months of medication; you will need to follow-up in 3 months with your new healthcare provider. I would recommend you continue your care with Deboraha Sprangebbie Gessner, FNP.

## 2014-08-08 ENCOUNTER — Encounter: Payer: Self-pay | Admitting: Family Medicine

## 2014-08-08 MED ORDER — LEVOTHYROXINE SODIUM 125 MCG PO TABS: 125.0000 ug | ORAL_TABLET | Freq: Every day | ORAL | Status: DC

## 2014-08-08 NOTE — Progress Notes (Signed)
Quick Note:  Please advise pt regarding following labs... Your recent thyroid results are in normal range. Continue current dose of medication. Please schedule a follow-up in 3-4 months to make sure you are stable on current medications.  Copy to pt. ______

## 2014-08-08 NOTE — Progress Notes (Signed)
Subjective:    Patient ID: Tammy Green, female    DOB: [birth year], 39 y.o.   MRN: [id number]  HPI  This 39 y.o. Female is here for follow-up re: depression and anxiety, currently tolerating Paxil 20 mg 1 tab daily. She is slightly improved but family issues (teenage son is in a group home and going through divorce) continue to be stressful. She requested increase Paxil dose to 30 mg daily. She has been taking alprazolam more frequently than prescribed.   Hypothyroidism- compliant w. Levothyroxine as prescribed. Energy is better and weight is down by 5 lbs. Pt has toddler w/ her today; this child keeps her very busy.  Patient Active Problem List   Diagnosis Date Noted  . Hypothyroidism 08/07/2014  . Depression with anxiety 08/07/2014  . History of depression 06/18/2014  . History of anxiety state 06/18/2014  . Lump of skin of upper extremity 06/18/2014  . Kidney stones 03/30/2012    Prior to Admission medications   Medication Sig Start Date End Date Taking? Authorizing Provider  ALPRAZolam (XANAX) 0.25 MG tablet Take 1 tablet (0.25 mg total) by mouth 2 (two) times daily as needed for anxiety.   Yes Maurice March, MD  antipyrine-benzocaine Lyla Son) otic solution Place 3-4 drops into both ears 3 (three) times daily. 06/13/14  Yes Maurice March, MD  ibuprofen (ADVIL,MOTRIN) 600 MG tablet Take 1 tablet (600 mg total) by mouth every 6 (six) hours as needed for pain. 05/29/12  Yes Tracey Harries, MD  levothyroxine (SYNTHROID, LEVOTHROID) 125 MCG tablet Take 1 tablet (125 mcg total) by mouth daily. 06/15/14  Yes Maurice March, MD  PARoxetine (PAXIL) 20 MG tablet Take 1 tablet (20 mg total) by mouth daily. 08/07/14   Maurice March, MD  Prenatal Vit-Fe Fumarate-FA (PRENATAL MULTIVITAMIN) TABS Take 1 tablet by mouth daily. 07/03/2014   Historical Provider, MD    Past Surgical History  Procedure Laterality Date  . Femur fracture surgery    . Wisdom tooth extraction       History   Social History  . Marital Status:     Spouse Name: N/A  . Number of Children: N/A  . Years of Education: N/A   Occupational History  . Not on file.   Social History Main Topics  . Smoking status: Former Smoker -- 0.25 packs/day for 18 years  . Smokeless tobacco: Former Neurosurgeon    Quit date: 11/15/2011  . Alcohol Use: No  . Drug Use: No  . Sexual Activity: Not Currently    Birth Control/ Protection: None     Comment: not since she conceived   Other Topics Concern  . Not on file   Social History Narrative  Teenage son in group home; she picked him up recently to go pick-up his report card >> he ran away. He is back at the group home.  Family History  Problem Relation Age of Onset  . Bipolar disorder Mother   . Bipolar disorder Sister   . Bipolar disorder Brother   . Diabetes Maternal Grandmother   . Cancer Maternal Grandfather     Review of Systems  Constitutional: Positive for fever, activity change and fatigue. Negative for diaphoresis, appetite change and unexpected weight change.  Eyes: Negative for visual disturbance.  Respiratory: Negative for chest tightness and shortness of breath.   Cardiovascular: Negative for chest pain and palpitations.  Gastrointestinal: Negative.   Neurological: Negative.   Psychiatric/Behavioral: Positive for sleep disturbance and dysphoric mood. Negative for  suicidal ideas, confusion, self-injury, decreased concentration and agitation. The patient is nervous/anxious.        Objective:   Physical Exam  Constitutional: She is oriented to person, place, and time. She appears well-developed and well-nourished. No distress.  HENT:  Head: Normocephalic and atraumatic.  Right Ear: External ear normal.  Left Ear: External ear normal.  Mouth/Throat: Oropharynx is clear and moist.  Eyes: Conjunctivae and EOM are normal. No scleral icterus.  Neck: Normal range of motion. Neck supple.  Cardiovascular: Normal rate.    Pulmonary/Chest: Effort normal. No respiratory distress.  Musculoskeletal: Normal range of motion.  Neurological: She is alert and oriented to person, place, and time. No cranial nerve deficit. Coordination normal.  Skin: Skin is warm and dry. She is not diaphoretic. No pallor.  Psychiatric: She has a normal mood and affect. Her behavior is normal. Judgment and thought content normal.  Nursing note and vitals reviewed.      Assessment & Plan:  Hypothyroidism due to acquired atrophy of thyroid - Continue current dose of levothyroxine 125 mcg 1 tab daily pending lab results. Plan: Thyroid Panel With TSH.  Depression with anxiety- Increase Paxil to 30 mg 1 tablet daily. Continue alprazolam prn. Metanx B vitamin supplement 1 tab daily. Provide follow-up in 4-6 weeks re: efficacy of increased Paxil dose or any adverse effects.  Follow-up in ~ 6 months with new provider.   Meds ordered this encounter  Medications  . PARoxetine (PAXIL) 30 MG tablet    Sig: Take 1 tablet (30 mg total) by mouth daily.    Dispense:  30 tablet    Refill:  5  . ALPRAZolam (XANAX) 0.25 MG tablet    Sig: Take 1 tablet (0.25 mg total) by mouth 2 (two) times daily as needed for anxiety.    Dispense:  30 tablet    Refill:  1  . l-methylfolate-B6-B12 (METANX) 3-35-2 MG TABS    Sig: Take 1 tablet by mouth daily.    Dispense:  30 tablet    Refill:  5

## 2014-08-28 ENCOUNTER — Ambulatory Visit: Payer: Medicaid Other | Admitting: Gastroenterology

## 2014-09-17 ENCOUNTER — Encounter (HOSPITAL_BASED_OUTPATIENT_CLINIC_OR_DEPARTMENT_OTHER): Admission: RE | Payer: Self-pay | Source: Ambulatory Visit

## 2014-09-17 ENCOUNTER — Ambulatory Visit (HOSPITAL_BASED_OUTPATIENT_CLINIC_OR_DEPARTMENT_OTHER): Admission: RE | Admit: 2014-09-17 | Payer: Medicaid Other | Source: Ambulatory Visit | Admitting: Surgery

## 2014-09-17 SURGERY — EXCISION LIPOMA
Anesthesia: Monitor Anesthesia Care | Laterality: Left

## 2014-12-16 ENCOUNTER — Encounter (HOSPITAL_COMMUNITY): Payer: Self-pay | Admitting: Emergency Medicine

## 2014-12-16 ENCOUNTER — Emergency Department (INDEPENDENT_AMBULATORY_CARE_PROVIDER_SITE_OTHER)
Admission: EM | Admit: 2014-12-16 | Discharge: 2014-12-16 | Disposition: A | Discharge status: Home / Self Care | Payer: Self-pay | Source: Home / Self Care | Attending: Family Medicine | Admitting: Family Medicine

## 2014-12-16 DIAGNOSIS — N390 Urinary tract infection, site not specified: Secondary | ICD-10-CM

## 2014-12-16 LAB — POCT URINALYSIS DIP (DEVICE)
Bilirubin Urine: NEGATIVE
Glucose, UA: NEGATIVE mg/dL
KETONES UR: NEGATIVE mg/dL
Nitrite: POSITIVE — AB
Protein, ur: NEGATIVE mg/dL
SPECIFIC GRAVITY, URINE: 1.025 (ref 1.005–1.030)
UROBILINOGEN UA: 0.2 mg/dL (ref 0.0–1.0)
pH: 7 (ref 5.0–8.0)

## 2014-12-16 LAB — POCT PREGNANCY, URINE: Preg Test, Ur: NEGATIVE

## 2014-12-16 MED ORDER — PHENAZOPYRIDINE HCL 100 MG PO TABS: 100.0000 mg | ORAL_TABLET | Freq: Three times a day (TID) | ORAL | Status: DC | PRN

## 2014-12-16 MED ORDER — CEPHALEXIN 500 MG PO CAPS: 500.0000 mg | ORAL_CAPSULE | Freq: Three times a day (TID) | ORAL | Status: DC

## 2014-12-16 NOTE — ED Notes (Signed)
Symptoms started Friday.  Patient concerned for uti.  Patient has used azo, cranberry pills, and increased water intake

## 2014-12-16 NOTE — Discharge Instructions (Signed)
Please take the Keflex as prescribed. You may also use the Pyridium for additional pain relief.

## 2014-12-16 NOTE — ED Provider Notes (Signed)
CSN: [id number]     Arrival date & time 12/16/14  1429 History   First MD Initiated Contact with Patient 12/16/14 1645     Chief Complaint  Patient presents with  . Urinary Tract Infection   (Consider location/radiation/quality/duration/timing/severity/associated sxs/prior Treatment) HPI   5 days of dysuria and frequency. Tried Azo tablets and increase fluid intake without improvement. Denies any fevers, back pain, nausea, vomiting, abdominal pain, vaginal discharge, constipation, diarrhea. Symptoms are intermittent but are getting worse.  Past Medical History  Diagnosis Date  . Anxiety   . Depression   . Kidney stones 2013   Past Surgical History  Procedure Laterality Date  . Femur fracture surgery    . Wisdom tooth extraction     Family History  Problem Relation Age of Onset  . Bipolar disorder Mother   . Bipolar disorder Sister   . Bipolar disorder Brother   . Diabetes Maternal Grandmother   . Cancer Maternal Grandfather    Social History  Substance Use Topics  . Smoking status: Former Smoker -- 0.25 packs/day for 18 years  . Smokeless tobacco: Former Neurosurgeon    Quit date: 11/15/2011  . Alcohol Use: No   OB History    Gravida Para Term Preterm AB TAB SAB Ectopic Multiple Living   Review of Systems Per HPI with all other pertinent systems negative.   Allergies  Axid and Codeine  Home Medications   Prior to Admission medications   Medication Sig Start Date End Date Taking? Authorizing Provider  CRANBERRY PO Take by mouth.   Yes Historical Provider, MD  ALPRAZolam (XANAX) 0.25 MG tablet Take 1 tablet (0.25 mg total) by mouth 2 (two) times daily as needed for anxiety. 08/07/14   Maurice March, MD  antipyrine-benzocaine Lyla Son) otic solution Place 3-4 drops into both ears 3 (three) times daily. 06/13/14   Maurice March, MD  cephALEXin (KEFLEX) 500 MG capsule Take 1 capsule (500 mg total) by mouth 3 (three) times daily. 12/16/14    Ozella Rocks, MD  ibuprofen (ADVIL,MOTRIN) 600 MG tablet Take 1 tablet (600 mg total) by mouth every 6 (six) hours as needed for pain. 05/29/12   Tracey Harries, MD  l-methylfolate-B6-B12 (METANX) 3-35-2 MG TABS Take 1 tablet by mouth daily. 08/07/14   Maurice March, MD  levothyroxine (SYNTHROID, LEVOTHROID) 125 MCG tablet Take 1 tablet (125 mcg total) by mouth daily. 08/08/14   Maurice March, MD  PARoxetine (PAXIL) 30 MG tablet Take 1 tablet (30 mg total) by mouth daily. 08/07/14   Maurice March, MD  phenazopyridine (PYRIDIUM) 100 MG tablet Take 1-2 tablets (100-200 mg total) by mouth 3 (three) times daily as needed for pain. 12/16/14   Ozella Rocks, MD  Prenatal Vit-Fe Fumarate-FA (PRENATAL MULTIVITAMIN) TABS Take 1 tablet by mouth daily.    Historical Provider, MD   BP 103/66 mmHg  Pulse 61  Temp(Src) 98.1 F (36.7 C) (Oral)  Resp 16  SpO2 99%  LMP 12/04/2014 Physical Exam Physical Exam  Constitutional: oriented to person, place, and time. appears well-developed and well-nourished. No distress.  HENT:  Head: Normocephalic and atraumatic.  Eyes: EOMI. PERRL.  Neck: Normal range of motion.  Cardiovascular: RRR, no m/r/g, 2+ distal pulses,  Pulmonary/Chest: Effort normal and breath sounds normal. No respiratory distress.  Abdominal: Soft. Bowel sounds are normal. NonTTP, no distension.  Musculoskeletal: Normal range of motion. Non ttp, no  effusion.  no CVA tenderness  Neurological: alert and oriented to person, place, and time.  Skin: Skin is warm. No rash noted. non diaphoretic.  Psychiatric: normal mood and affect. behavior is normal. Judgment and thought content normal.   ED Course  Procedures (including critical care time) Labs Review Labs Reviewed  POCT URINALYSIS DIP (DEVICE) - Abnormal; Notable for the following:    Hgb urine dipstick SMALL (*)    Nitrite POSITIVE (*)    Leukocytes, UA SMALL (*)    All other components within normal limits  POCT  PREGNANCY, URINE    Imaging Review No results found.   MDM   1. UTI (lower urinary tract infection)    Keflex, Pyridium, urine culture.    Ozella Rocks, MD 12/16/14 [phone]

## 2014-12-17 ENCOUNTER — Other Ambulatory Visit: Payer: Self-pay

## 2014-12-17 MED ORDER — PAROXETINE HCL 20 MG PO TABS: 30.0000 mg | ORAL_TABLET | Freq: Every day | ORAL | Status: DC

## 2014-12-17 NOTE — Telephone Encounter (Signed)
Pharm sent req to change pt's Paxil Rx to the 20 mg tabs, 1.5 tabs daily, instead of the 30 mg tabs, 1 tab QD due to being less expensive for pt. Since dose doesn't change, I will go ahead and send in new Rx w/RFs left on Dr McPherson's last Rx.

## 2015-05-12 ENCOUNTER — Other Ambulatory Visit: Payer: Self-pay | Admitting: Physician Assistant

## 2015-10-31 ENCOUNTER — Ambulatory Visit: Payer: Self-pay | Attending: Internal Medicine

## 2015-12-02 ENCOUNTER — Ambulatory Visit (INDEPENDENT_AMBULATORY_CARE_PROVIDER_SITE_OTHER): Payer: No Typology Code available for payment source | Admitting: Family Medicine

## 2015-12-02 ENCOUNTER — Encounter: Payer: Self-pay | Admitting: Family Medicine

## 2015-12-02 VITALS — BP 94/63 | HR 73 | Temp 98.1°F | Resp 16 | Ht 67.0 in | Wt 200.0 lb

## 2015-12-02 DIAGNOSIS — Z0000 Encounter for general adult medical examination without abnormal findings: Secondary | ICD-10-CM

## 2015-12-02 DIAGNOSIS — E039 Hypothyroidism, unspecified: Secondary | ICD-10-CM

## 2015-12-02 DIAGNOSIS — S6991XA Unspecified injury of right wrist, hand and finger(s), initial encounter: Secondary | ICD-10-CM

## 2015-12-02 LAB — CBC WITH DIFFERENTIAL/PLATELET
BASOS PCT: 0 %
Basophils Absolute: 0 cells/uL (ref 0–200)
EOS ABS: 88 {cells}/uL (ref 15–500)
Eosinophils Relative: 1 %
HEMATOCRIT: 38.5 % (ref 35.0–45.0)
Hemoglobin: 13 g/dL (ref 11.7–15.5)
LYMPHS ABS: 2024 {cells}/uL (ref 850–3900)
LYMPHS PCT: 23 %
MCH: 31.5 pg (ref 27.0–33.0)
MCHC: 33.8 g/dL (ref 32.0–36.0)
MCV: 93.2 fL (ref 80.0–100.0)
MONO ABS: 440 {cells}/uL (ref 200–950)
MONOS PCT: 5 %
MPV: 9.4 fL (ref 7.5–12.5)
NEUTROS ABS: 6248 {cells}/uL (ref 1500–7800)
NEUTROS PCT: 71 %
PLATELETS: 289 10*3/uL (ref 140–400)
RBC: 4.13 MIL/uL (ref 3.80–5.10)
RDW: 14.5 % (ref 11.0–15.0)
WBC: 8.8 10*3/uL (ref 3.8–10.8)

## 2015-12-02 LAB — TSH: TSH: 131.67 m[IU]/L — AB

## 2015-12-02 LAB — LIPID PANEL
Cholesterol: 221 mg/dL — ABNORMAL HIGH (ref 125–200)
HDL: 54 mg/dL (ref >=46)
LDL CALC: 139 mg/dL — AB (ref <130)
Total CHOL/HDL Ratio: 4.1 Ratio (ref <=5.0)
Triglycerides: 139 mg/dL (ref <150)
VLDL: 28 mg/dL (ref <30)

## 2015-12-02 LAB — COMPLETE METABOLIC PANEL WITH GFR
ALBUMIN: 4.2 g/dL (ref 3.6–5.1)
ALK PHOS: 35 U/L (ref 33–115)
ALT: 24 U/L (ref 6–29)
AST: 46 U/L — ABNORMAL HIGH (ref 10–30)
BUN: 7 mg/dL (ref 7–25)
CO2: 22 mmol/L (ref 20–31)
Calcium: 8.7 mg/dL (ref 8.6–10.2)
Chloride: 105 mmol/L (ref 98–110)
Creat: 0.97 mg/dL (ref 0.50–1.10)
GFR, EST NON AFRICAN AMERICAN: 73 mL/min (ref >=60)
GFR, Est African American: 84 mL/min (ref >=60)
GLUCOSE: 82 mg/dL (ref 65–99)
POTASSIUM: 3.6 mmol/L (ref 3.5–5.3)
SODIUM: 139 mmol/L (ref 135–146)
Total Bilirubin: 0.7 mg/dL (ref 0.2–1.2)
Total Protein: 7 g/dL (ref 6.1–8.1)

## 2015-12-03 ENCOUNTER — Other Ambulatory Visit (HOSPITAL_COMMUNITY): Payer: Self-pay | Admitting: Family Medicine

## 2015-12-03 DIAGNOSIS — F329 Major depressive disorder, single episode, unspecified: Secondary | ICD-10-CM

## 2015-12-03 DIAGNOSIS — E039 Hypothyroidism, unspecified: Secondary | ICD-10-CM

## 2015-12-03 DIAGNOSIS — F32A Depression, unspecified: Secondary | ICD-10-CM

## 2015-12-03 LAB — T3: T3, Total: 29 ng/dL — ABNORMAL LOW (ref 76–181)

## 2015-12-03 LAB — T4

## 2015-12-03 MED ORDER — LEVOTHYROXINE SODIUM 125 MCG PO TABS: 125.0000 ug | ORAL_TABLET | Freq: Every day | ORAL | 5 refills | Status: DC

## 2015-12-03 MED ORDER — PAROXETINE HCL 20 MG PO TABS: 30.0000 mg | ORAL_TABLET | Freq: Every day | ORAL | 1 refills | Status: DC

## 2015-12-04 ENCOUNTER — Other Ambulatory Visit (HOSPITAL_COMMUNITY): Payer: Self-pay | Admitting: Family Medicine

## 2015-12-04 ENCOUNTER — Telehealth: Payer: Self-pay

## 2015-12-04 NOTE — Progress Notes (Signed)
Tammy Green Cantara, is a 40 y.o. female  WNU:[id number]  IHK:[id number]  DOB - 1975/06/17  CC:  Chief Complaint  Patient presents with  . Establish Care  . Hypothyroidism  . Depression  . Finger Injury    x 1 month ago        HPI: Tammy Green Madani is a 40 y.o. female here to establish care. She has a history of hypothyroidism, depression, anxiety. She also complains today of right little finger pain since a recent injury. She reports she cannot straighten her finger normally. She is currently not on her medication for hypothyroidism or depression and would like to restart.   Allergies  Allergen Reactions  . Axid [Nizatidine] Hives  . Codeine Hives    Pt states she can take Percocet but can not take Vicodin   Past Medical History:  Diagnosis Date  . Anxiety   . Depression   . Kidney stones 2013  . Thyroid disease    Current Outpatient Prescriptions on File Prior to Visit  Medication Sig Dispense Refill  . ALPRAZolam (XANAX) 0.25 MG tablet Take 1 tablet (0.25 mg total) by mouth 2 (two) times daily as needed for anxiety. (Patient not taking: Reported on 12/02/2015) 30 tablet 1  . antipyrine-benzocaine (AURALGAN) otic solution Place 3-4 drops into both ears 3 (three) times daily. (Patient not taking: Reported on 12/02/2015) 10 mL 0  . cephALEXin (KEFLEX) 500 MG capsule Take 1 capsule (500 mg total) by mouth 3 (three) times daily. (Patient not taking: Reported on 12/02/2015) 21 capsule 0  . CRANBERRY PO Take by mouth.    Marland Kitchen. ibuprofen (ADVIL,MOTRIN) 600 MG tablet Take 1 tablet (600 mg total) by mouth every 6 (six) hours as needed for pain. (Patient not taking: Reported on 12/02/2015) 30 tablet 0  . l-methylfolate-B6-B12 (METANX) 3-35-2 MG TABS Take 1 tablet by mouth daily. (Patient not taking: Reported on 12/02/2015) 30 tablet 5  . phenazopyridine (PYRIDIUM) 100 MG tablet Take 1-2 tablets (100-200 mg total) by mouth 3 (three) times daily as needed for pain. (Patient not taking: Reported on  12/02/2015) 30 tablet 0  . Prenatal Vit-Fe Fumarate-FA (PRENATAL MULTIVITAMIN) TABS Take 1 tablet by mouth daily.     No current facility-administered medications on file prior to visit.    Family History  Problem Relation Age of Onset  . Bipolar disorder Mother   . Bipolar disorder Sister   . Bipolar disorder Brother   . Diabetes Maternal Grandmother   . Cancer Maternal Grandfather    Social History   Social History  . Marital status: Single    Spouse name: N/A  . Number of children: N/A  . Years of education: N/A   Occupational History  . Not on file.   Social History Main Topics  . Smoking status: Former Smoker    Packs/day: 0.25    Years: 18.00  . Smokeless tobacco: Former NeurosurgeonUser    Quit date: 11/15/2011  . Alcohol use No  . Drug use:   . Sexual activity: Not Currently    Birth control/ protection: None     Comment: not since she conceived   Other Topics Concern  . Not on file   Social History Narrative  . No narrative on file    Review of Systems: Constitutional: Negative for fever, chills, appetite change, weight loss,  Fatigue. Skin: Negative for rashes or lesions of concern. HENT: Negative for ear pain, ear discharge.nose bleeds Eyes: Negative for pain, discharge, redness, itching and visual disturbance. Neck:  Negative for pain, stiffness Respiratory: Negative for cough, shortness of breath,   Cardiovascular: Negative for chest pain, palpitations and leg swelling. Gastrointestinal: Positive for intermittent loose stools and crampy abd pain. Negative  For N/V/ constipations Genitourinary: Negative for dysuria, urgency, frequency, hematuria,  Musculoskeletal: Positive for pain and deformity of right little finger. She has pins and screws in her right knee and femur with pain Neurological: Negative for dizziness, tremors, seizures, syncope,   light-headedness, numbness and headaches.  Hematological: Negative for easy bruising or bleeding Psychiatric/Behavioral:  Positive for depression, anxiety.   Objective:   Vitals:   12/02/15 1447  BP: 94/63  Pulse: 73  Resp: 16  Temp: 98.1 F (36.7 C)    Physical Exam: Constitutional: Patient appears well-developed and well-nourished. No distress. HENT: Normocephalic, atraumatic, External right and left ear normal. Oropharynx is clear and moist.  Eyes: Conjunctivae and EOM are normal. PERRLA, no scleral icterus. Neck: Normal ROM. Neck supple. No lymphadenopathy, No thyromegaly. CVS: RRR, S1/S2 +, no murmurs, no gallops, no rubs Pulmonary: Effort and breath sounds normal, no stridor, rhonchi, wheezes, rales.  Abdominal: Soft. Normoactive BS,, no distension, tenderness, rebound or guarding.  Musculoskeletal: Normal range of motion. No edema and no tenderness except for r. Little finger which does not fully straignten and bright pink in the distal section. Neuro: Alert.Normal muscle tone coordination. Non-focal Skin: Skin is warm and dry. No rash noted. Not diaphoretic. No erythema. No pallor. Psychiatric: Normal mood and affect. Behavior, judgment, thought content normal.  Lab Results  Component Value Date   WBC 8.8 12/02/2015   HGB 13.0 12/02/2015   HCT 38.5 12/02/2015   MCV 93.2 12/02/2015   PLT 289 12/02/2015   Lab Results  Component Value Date   CREATININE 0.97 12/02/2015   BUN 7 12/02/2015   NA 139 12/02/2015   K 3.6 12/02/2015   CL 105 12/02/2015   CO2 22 12/02/2015    No results found for: HGBA1C Lipid Panel     Component Value Date/Time   CHOL 221 (H) 12/02/2015 1527   TRIG 139 12/02/2015 1527   HDL 54 12/02/2015 1527   CHOLHDL 4.1 12/02/2015 1527   VLDL 28 12/02/2015 1527   LDLCALC 139 (H) 12/02/2015 1527       Assessment and plan:   1. Hypothyroidism, unspecified hypothyroidism type  - TSH - T3 - T4  2. Healthcare maintenance  - COMPLETE METABOLIC PANEL WITH GFR - CBC w/Diff - Lipid panel  3. Finger injury, right, initial encounter  - DG Finger Little  Right; Future   No Follow-up on file.  The patient was given clear instructions to go to ER or return to medical center if symptoms don't improve, worsen or new problems develop. The patient verbalized understanding.    Henrietta Hoover FNP  12/04/2015, 8:06 AM

## 2015-12-04 NOTE — Patient Instructions (Signed)
Will call in medications once I review your labs.

## 2015-12-05 NOTE — Telephone Encounter (Signed)
Called and spoke with patient, advised of labs and to start medications as prescribed. Patient verbalized understanding. Thanks!

## 2015-12-08 ENCOUNTER — Encounter: Payer: Self-pay | Admitting: Obstetrics & Gynecology

## 2016-03-16 ENCOUNTER — Other Ambulatory Visit (INDEPENDENT_AMBULATORY_CARE_PROVIDER_SITE_OTHER): Payer: No Typology Code available for payment source

## 2016-03-16 ENCOUNTER — Other Ambulatory Visit: Payer: Self-pay

## 2016-03-16 ENCOUNTER — Other Ambulatory Visit: Payer: Self-pay | Admitting: Family Medicine

## 2016-03-16 DIAGNOSIS — E039 Hypothyroidism, unspecified: Secondary | ICD-10-CM

## 2016-03-16 DIAGNOSIS — F32A Depression, unspecified: Secondary | ICD-10-CM

## 2016-03-16 DIAGNOSIS — F329 Major depressive disorder, single episode, unspecified: Secondary | ICD-10-CM

## 2016-03-16 LAB — T4, FREE: FREE T4: 1.1 ng/dL (ref 0.8–1.8)

## 2016-03-16 LAB — TSH: TSH: 5.86 mIU/L — ABNORMAL HIGH

## 2016-03-16 MED ORDER — PAROXETINE HCL 20 MG PO TABS: 30.0000 mg | ORAL_TABLET | Freq: Every day | ORAL | 1 refills | Status: DC

## 2016-03-16 NOTE — Telephone Encounter (Signed)
Refill for paroxitine sent into pharmacy. Thanks!

## 2016-03-17 LAB — T3: T3, Total: 112 ng/dL (ref 76–181)

## 2016-06-07 ENCOUNTER — Ambulatory Visit: Payer: No Typology Code available for payment source | Admitting: Family Medicine

## 2016-06-22 ENCOUNTER — Ambulatory Visit: Payer: No Typology Code available for payment source | Admitting: Family Medicine

## 2016-07-15 ENCOUNTER — Encounter: Payer: Self-pay | Admitting: Family Medicine

## 2016-07-15 ENCOUNTER — Ambulatory Visit (INDEPENDENT_AMBULATORY_CARE_PROVIDER_SITE_OTHER): Payer: Self-pay | Admitting: Family Medicine

## 2016-07-15 VITALS — BP 107/57 | HR 68 | Temp 98.3°F | Resp 14 | Ht 67.0 in | Wt 199.0 lb

## 2016-07-15 DIAGNOSIS — E034 Atrophy of thyroid (acquired): Secondary | ICD-10-CM

## 2016-07-15 DIAGNOSIS — F32A Depression, unspecified: Secondary | ICD-10-CM

## 2016-07-15 DIAGNOSIS — F418 Other specified anxiety disorders: Secondary | ICD-10-CM

## 2016-07-15 DIAGNOSIS — F329 Major depressive disorder, single episode, unspecified: Secondary | ICD-10-CM

## 2016-07-15 LAB — TSH: TSH: 40.18 mIU/L — ABNORMAL HIGH

## 2016-07-15 MED ORDER — PAROXETINE HCL 40 MG PO TABS: ORAL_TABLET | ORAL | 5 refills | Status: DC

## 2016-07-15 NOTE — Progress Notes (Signed)
yes

## 2016-07-15 NOTE — Patient Instructions (Addendum)
Breast cancer and cervical cancer screening. [phone]    Living With Anxiety After being diagnosed with an anxiety disorder, you may be relieved to know why you have felt or behaved a certain way. It is natural to also feel overwhelmed about the treatment ahead and what it will mean for your life. With care and support, you can manage this condition and recover from it. How to cope with anxiety Dealing with stress  Stress is your body's reaction to life changes and events, both good and bad. Stress can last just a few hours or it can be ongoing. Stress can play a major role in anxiety, so it is important to learn both how to cope with stress and how to think about it differently. Talk with your health care provider or a counselor to learn more about stress reduction. He or she may suggest some stress reduction techniques, such as:  Music therapy. This can include creating or listening to music that you enjoy and that inspires you.  Mindfulness-based meditation. This involves being aware of your normal breaths, rather than trying to control your breathing. It can be done while sitting or walking.  Centering prayer. This is a kind of meditation that involves focusing on a word, phrase, or sacred image that is meaningful to you and that brings you peace.  Deep breathing. To do this, expand your stomach and inhale slowly through your nose. Hold your breath for 3-5 seconds. Then exhale slowly, allowing your stomach muscles to relax.  Self-talk. This is a skill where you identify thought patterns that lead to anxiety reactions and correct those thoughts.  Muscle relaxation. This involves tensing muscles then relaxing them. Choose a stress reduction technique that fits your lifestyle and personality. Stress reduction techniques take time and practice. Set aside 5-15 minutes a day to do them. Therapists can offer training in these techniques. The training may be covered by some insurance plans.  Other things you can do to manage stress include:  Keeping a stress diary. This can help you learn what triggers your stress and ways to control your response.  Thinking about how you respond to certain situations. You may not be able to control everything, but you can control your reaction.  Making time for activities that help you relax, and not feeling guilty about spending your time in this way. Therapy combined with coping and stress-reduction skills provides the best chance for successful treatment. Medicines  Medicines can help ease symptoms. Medicines for anxiety include:  Anti-anxiety drugs.  Antidepressants.  Beta-blockers. Medicines may be used as the main treatment for anxiety disorder, along with therapy, or if other treatments are not working. Medicines should be prescribed by a health care provider. Relationships  Relationships can play a big part in helping you recover. Try to spend more time connecting with trusted friends and family members. Consider going to couples counseling, taking family education classes, or going to family therapy. Therapy can help you and others better understand the condition. How to recognize changes in your condition Everyone has a different response to treatment for anxiety. Recovery from anxiety happens when symptoms decrease and stop interfering with your daily activities at home or work. This may mean that you will start to:  Have better concentration and focus.  Sleep better.  Be less irritable.  Have more energy.  Have improved memory. It is important to recognize when your condition is getting worse. Contact your health care provider if your symptoms interfere with home or  work and you do not feel like your condition is improving. Where to find help and support: You can get help and support from these sources:  Self-help groups.  Online and [employer].  A trusted spiritual leader.  Couples counseling.  Family  education classes.  Family therapy. Follow these instructions at home:  Eat a healthy diet that includes plenty of vegetables, fruits, whole grains, low-fat dairy products, and lean protein. Do not eat a lot of foods that are high in solid fats, added sugars, or salt.  Exercise. Most adults should do the following:  Exercise for at least 150 minutes each week. The exercise should increase your heart rate and make you sweat (moderate-intensity exercise).  Strengthening exercises at least twice a week.  Cut down on caffeine, tobacco, alcohol, and other potentially harmful substances.  Get the right amount and quality of sleep. Most adults need 7-9 hours of sleep each night.  Make choices that simplify your life.  Take over-the-counter and prescription medicines only as told by your health care provider.  Avoid caffeine, alcohol, and certain over-the-counter cold medicines. These may make you feel worse. Ask your pharmacist which medicines to avoid.  Keep all follow-up visits as told by your health care provider. This is important. Questions to ask your health care provider  Would I benefit from therapy?  How often should I follow up with a health care provider?  How long do I need to take medicine?  Are there any long-term side effects of my medicine?  Are there any alternatives to taking medicine? Contact a health care provider if:  You have a hard time staying focused or finishing daily tasks.  You spend many hours a day feeling worried about everyday life.  You become exhausted by worry.  You start to have headaches, feel tense, or have nausea.  You urinate more than normal.  You have diarrhea. Get help right away if:  You have a racing heart and shortness of breath.  You have thoughts of hurting yourself or others. If you ever feel like you may hurt yourself or others, or have thoughts about taking your own life, get help right away. You can go to your nearest  emergency department or call:  Your local emergency services (911 in the [place]  A suicide crisis helpline, such as the National Suicide Prevention Lifeline at 936 885 6772. This is open 24-hours a day. Summary  Taking steps to deal with stress can help calm you.  Medicines cannot cure anxiety disorders, but they can help ease symptoms.  Family, friends, and partners can play a big part in helping you recover from an anxiety disorder. This information is not intended to replace advice given to you by your health care provider. Make sure you discuss any questions you have with your health care provider. Document Released: 04/06/2016 Document Revised: 04/06/2016 Document Reviewed: 04/06/2016 Elsevier Interactive Patient Education  2017 [employer] Inc.  Major Depressive Disorder, Adult Major depressive disorder (MDD) is a mental health condition. MDD often makes you feel sad, hopeless, or helpless. MDD can also cause symptoms in your body. MDD can affect your:  Work.  School.  Relationships.  Other normal activities. MDD can range from mild to very bad. It may occur once (single episode MDD). It can also occur many times (recurrent MDD). The main symptoms of MDD often include:  Feeling sad, depressed, or irritable most of the time.  Loss of interest. MDD symptoms also include:  Sleeping too much or too  little.  Eating too much or too little.  A change in your weight.  Feeling tired (fatigue) or having low energy.  Feeling worthless.  Feeling guilty.  Trouble making decisions.  Trouble thinking clearly.  Thoughts of suicide or harming others.  Feeling weak.  Feeling agitated.  Keeping yourself from being around other people (isolation). Follow these instructions at home: Activity   Do these things as told by your doctor:  Go back to your normal activities.  Exercise regularly.  Spend time outdoors. Alcohol   Talk with your doctor about how alcohol can  affect your antidepressant medicines.  Do not drink alcohol. Or, limit how much alcohol you drink.  This means no more than 1 drink a day for nonpregnant women and 2 drinks a day for men. One drink equals one of these:  12 oz of beer.  5 oz of wine.  1 oz of hard liquor. General instructions   Take over-the-counter and prescription medicines only as told by your doctor.  Eat a healthy diet.  Get plenty of sleep.  Find activities that you enjoy. Make time to do them.  Think about joining a support group. Your doctor may be able to suggest a group for you.  Keep all follow-up visits as told by your doctor. This is important. Where to find more information:   The [employer] on Mental Illness:  www.nami.org  [place] [employer] of Mental Health:  http://www.maynard.net/  National Suicide Prevention Lifeline:  431-295-6393. This is free, 24-hour help. Contact a doctor if:  Your symptoms get worse.  You have new symptoms. Get help right away if:  You self-harm.  You see, hear, taste, smell, or feel things that are not present (hallucinate). If you ever feel like you may hurt yourself or others, or have thoughts about taking your own life, get help right away. You can go to your nearest emergency department or call:  Your local emergency services (911 in the [place]  A suicide crisis helpline, such as the National Suicide Prevention Lifeline:  845-425-5513. This is open 24 hours a day. This information is not intended to replace advice given to you by your health care provider. Make sure you discuss any questions you have with your health care provider. Document Released: 03/24/2015 Document Revised: 12/28/2015 Document Reviewed: 12/28/2015 Elsevier Interactive Patient Education  2017 [employer].

## 2016-07-15 NOTE — Progress Notes (Signed)
Subjective:    Patient ID: Tammy Green, female    DOB: 1975-10-05, 41 y.o.   MRN: [id number]  Depression         The current episode started more than 1 year ago.   The onset quality is gradual.   The problem occurs constantly.  The problem has been gradually worsening since onset.  Associated symptoms include decreased concentration, fatigue, irritable and decreased interest.  Associated symptoms include no helplessness, no hopelessness, does not have insomnia, no restlessness, no body aches, no myalgias, no headaches, no indigestion, not sad and no suicidal ideas.     The symptoms are aggravated by family issues (Patient reports that son has been dealing with psych issures, which has worsended her depression. ).  Past treatments include SSRIs - Selective serotonin reuptake inhibitors.  Compliance with treatment is variable.  Risk factors include family history of mental illness and major life event.   Past medical history includes thyroid problem.   Thyroid Problem  Presents for follow-up visit. Symptoms include anxiety, depressed mood and fatigue. Patient reports no cold intolerance, constipation, diaphoresis, diarrhea, dry skin, hair loss, hoarse voice, leg swelling, menstrual problem, nail problem, palpitations, tremors, visual change or weight gain. The symptoms have been stable.   Past Medical History:  Diagnosis Date  . Anxiety   . Depression   . Kidney stones 2013  . Thyroid disease    Immunization History  Administered Date(s) Administered  . Influenza,inj,Quad PF,36+ Mos 06/13/2014  . Tdap 11/25/2011   Social History   Social History  . Marital status: Single    Spouse name: N/A  . Number of children: N/A  . Years of education: N/A   Occupational History  . Not on file.   Social History Main Topics  . Smoking status: Current Some Day Smoker    Packs/day: 0.25    Years: 18.00  . Smokeless tobacco: Former Neurosurgeon    Quit date: 11/15/2011  . Alcohol use No  . Drug  use: No  . Sexual activity: Not Currently    Birth control/ protection: None     Comment: not since she conceived   Other Topics Concern  . Not on file   Social History Narrative  . No narrative on file   Allergies  Allergen Reactions  . Axid [Nizatidine] Hives  . Codeine Hives    Pt states she can take Percocet but can not take Vicodin     Review of Systems  Constitutional: Positive for fatigue. Negative for diaphoresis and weight gain.  HENT: Negative for hoarse voice.   Cardiovascular: Negative for palpitations.  Gastrointestinal: Negative for constipation and diarrhea.  Endocrine: Negative for cold intolerance.  Genitourinary: Negative for menstrual problem.  Musculoskeletal: Negative for myalgias.  Neurological: Negative for tremors and headaches.  Psychiatric/Behavioral: Positive for decreased concentration and depression. Negative for suicidal ideas. The patient is nervous/anxious. The patient does not have insomnia.        Objective:   Physical Exam  Constitutional: She is oriented to person, place, and time. She appears well-developed and well-nourished. She is irritable.  HENT:  Head: Normocephalic and atraumatic.  Right Ear: External ear normal.  Left Ear: External ear normal.  Nose: Nose normal.  Mouth/Throat: Oropharynx is clear and moist.  Eyes: Conjunctivae and EOM are normal. Pupils are equal, round, and reactive to light.  Neck: Normal range of motion. Neck supple.  Cardiovascular: Normal rate, regular rhythm, normal heart sounds and intact distal pulses.   Abdominal:  Soft. Bowel sounds are normal.  Neurological: She is alert and oriented to person, place, and time. She has normal reflexes.  Skin: Skin is warm and dry. No rash noted. No erythema. No pallor.  Psychiatric: Her speech is normal and behavior is normal. Thought content normal. She exhibits a depressed mood (tearful). She expresses no homicidal and no suicidal ideation.     BP (!) 107/57  (BP Location: Left Arm, Patient Position: Sitting, Cuff Size: Normal)   Pulse 68   Temp 98.3 F (36.8 C) (Oral)   Resp 14   Ht 5\' 7"  (1.702 m)   Wt 199 lb (90.3 kg)   LMP 07/13/2016   SpO2 100%   BMI 31.17 kg/m  Assessment & Plan:  1. Depression, unspecified depression type Depression screen [facility] 07/15/2016 07/15/2016 12/02/2015 06/13/2014  Decreased Interest 3 0 2 0  Down, Depressed, Hopeless 2 1 2  0  PHQ - 2 Score 5 1 4  0  Altered sleeping 1 - - -  Tired, decreased energy 2 - - -  Change in appetite 1 - - -  Feeling bad or failure about yourself  1 - - -  Trouble concentrating 1 - - -  Moving slowly or fidgety/restless 1 - - -  Suicidal thoughts 0 - - -  PHQ-9 Score 12 - - -   - PARoxetine (PAXIL) 40 MG tablet; Take 40 mg tablet daily  Dispense: 30 tablet; Refill: 5  2. Hypothyroidism due to acquired atrophy of thyroid Will check TSH. Will follow up by phone with laboratory results. Continue levothyroxine.  - TSH  3. Depression with anxiety Incxreased Paxil to 40 mg daily  RTC: 3 months for depression and anxiety  Knox Cervi Rennis PettyMoore Machel Violante  MSN, FNP-C [facility] [address]  [place], [place] [address] [phone]

## 2016-07-16 ENCOUNTER — Other Ambulatory Visit: Payer: Self-pay | Admitting: Family Medicine

## 2016-07-16 DIAGNOSIS — E034 Atrophy of thyroid (acquired): Secondary | ICD-10-CM

## 2016-07-16 MED ORDER — LEVOTHYROXINE SODIUM 125 MCG PO TABS: 125.0000 ug | ORAL_TABLET | Freq: Every day | ORAL | 5 refills | Status: DC

## 2016-07-16 NOTE — Progress Notes (Signed)
Meds ordered this encounter  Medications  . levothyroxine (SYNTHROID, LEVOTHROID) 125 MCG tablet    Sig: Take 1 tablet (125 mcg total) by mouth daily.    Dispense:  30 tablet    Refill:  5    Estelle Skibicki Rennis PettyMoore Shamia Uppal  MSN, FNP-C [facility] [address]  [place], [place] [address] [phone]

## 2016-08-05 ENCOUNTER — Ambulatory Visit: Payer: No Typology Code available for payment source

## 2016-08-16 ENCOUNTER — Other Ambulatory Visit: Payer: Self-pay

## 2016-08-16 DIAGNOSIS — E034 Atrophy of thyroid (acquired): Secondary | ICD-10-CM

## 2016-08-16 MED ORDER — LEVOTHYROXINE SODIUM 125 MCG PO TABS: 125.0000 ug | ORAL_TABLET | Freq: Every day | ORAL | 5 refills | Status: DC

## 2016-08-26 ENCOUNTER — Other Ambulatory Visit: Payer: No Typology Code available for payment source

## 2016-09-02 ENCOUNTER — Other Ambulatory Visit: Payer: No Typology Code available for payment source

## 2016-09-07 ENCOUNTER — Other Ambulatory Visit: Payer: No Typology Code available for payment source

## 2016-10-26 ENCOUNTER — Ambulatory Visit: Payer: No Typology Code available for payment source

## 2016-11-03 ENCOUNTER — Ambulatory Visit: Payer: Self-pay | Attending: Internal Medicine

## 2017-01-17 ENCOUNTER — Ambulatory Visit: Payer: No Typology Code available for payment source | Admitting: Family Medicine

## 2017-03-08 ENCOUNTER — Encounter: Payer: Self-pay | Admitting: Family Medicine

## 2017-03-08 ENCOUNTER — Ambulatory Visit (INDEPENDENT_AMBULATORY_CARE_PROVIDER_SITE_OTHER): Payer: Self-pay | Admitting: Family Medicine

## 2017-03-08 VITALS — BP 114/66 | HR 74 | Temp 98.7°F | Ht 67.0 in | Wt 201.0 lb

## 2017-03-08 DIAGNOSIS — H6120 Impacted cerumen, unspecified ear: Secondary | ICD-10-CM

## 2017-03-08 DIAGNOSIS — F419 Anxiety disorder, unspecified: Secondary | ICD-10-CM

## 2017-03-08 DIAGNOSIS — E039 Hypothyroidism, unspecified: Secondary | ICD-10-CM

## 2017-03-08 DIAGNOSIS — F32A Depression, unspecified: Secondary | ICD-10-CM

## 2017-03-08 DIAGNOSIS — F329 Major depressive disorder, single episode, unspecified: Secondary | ICD-10-CM

## 2017-03-08 DIAGNOSIS — R5383 Other fatigue: Secondary | ICD-10-CM

## 2017-03-08 LAB — COMPLETE METABOLIC PANEL WITH GFR
AG RATIO: 1.4 (calc) (ref 1.0–2.5)
ALKALINE PHOSPHATASE (APISO): 64 U/L (ref 33–115)
ALT: 18 U/L (ref 6–29)
AST: 17 U/L (ref 10–30)
Albumin: 4.2 g/dL (ref 3.6–5.1)
BUN: 8 mg/dL (ref 7–25)
CALCIUM: 9.4 mg/dL (ref 8.6–10.2)
CHLORIDE: 104 mmol/L (ref 98–110)
CO2: 28 mmol/L (ref 20–32)
CREATININE: 0.64 mg/dL (ref 0.50–1.10)
GFR, EST AFRICAN AMERICAN: 128 mL/min/{1.73_m2} (ref >=60)
GFR, Est Non African American: 111 mL/min/{1.73_m2} (ref >=60)
Globulin: 2.9 g/dL (calc) (ref 1.9–3.7)
Glucose, Bld: 79 mg/dL (ref 65–99)
Potassium: 4.6 mmol/L (ref 3.5–5.3)
Sodium: 139 mmol/L (ref 135–146)
Total Bilirubin: 0.6 mg/dL (ref 0.2–1.2)
Total Protein: 7.1 g/dL (ref 6.1–8.1)

## 2017-03-08 LAB — EXTRA LAV TOP TUBE

## 2017-03-08 LAB — THYROID PANEL WITH TSH
FREE THYROXINE INDEX: 1.8 (ref 1.4–3.8)
T3 UPTAKE: 28 % (ref 22–35)
T4, Total: 6.5 ug/dL (ref 5.1–11.9)
TSH: 10.24 mIU/L — ABNORMAL HIGH

## 2017-03-08 MED ORDER — PAROXETINE HCL 30 MG PO TABS: 60.0000 mg | ORAL_TABLET | ORAL | 3 refills | Status: DC

## 2017-03-08 MED ORDER — BUSPIRONE HCL 10 MG PO TABS: 10.0000 mg | ORAL_TABLET | Freq: Three times a day (TID) | ORAL | 2 refills | Status: DC

## 2017-03-08 MED ORDER — LEVOTHYROXINE SODIUM 125 MCG PO TABS: 125.0000 ug | ORAL_TABLET | Freq: Every day | ORAL | 5 refills | Status: DC

## 2017-03-08 NOTE — Progress Notes (Signed)
Tammy Green, is a 41 y.o. female  ZOX:[id number]  [id number]  DOB - May 22, 1975  CC:  Chief Complaint  Patient presents with  . Follow-up    DEPRESSION,ANXIETY       HPI: Tammy GuardianSarah Green is a 41 y.o. female is here to establish care and evaluation of hypothyroidism ,major depression and generalized anxiety disorder.  In clinic July 15, 2016 and has been lost to follow-up since this time. Kelani's history is significant for major depressive disorder.  During her last office visit she was started on Paxil 40 mg daily which she reports running out of medication.  She reports worsening depression accompanied by some suicidal thoughts, insomnia, and worrying.  Family history is significant for mental health disorders.She reports that her 41 year old son at present is in a locked behavioral health facility. She is attending therapy sessions with her two children as they both also have underling mental health problems. She reports lack of social and family support. She has a close friend with her at today's office visit. She admits to feelings of hopelessness. Tammy Green is able to maintain employment and works two jobs. Tammy Green also suffers from hypothyroidism and only complains of associated fatigue. She reports adherence with medication. Last TSH  40.18. She is currently prescribed levothyroxine 125 mcg and never returned to office for repeat TSH. Tammy Green has one additional complaint today of cerumen impaction.  Reports diminished hearing associated with increased cerumen production. Request ear irrigation today.  Allergies  Allergen Reactions  . Axid [Nizatidine] Hives  . Codeine Hives    Pt states she can take Percocet but can not take Vicodin   Past Medical History:  Diagnosis Date  . Anxiety   . Depression   . Kidney stones 2013  . Thyroid disease    Current Outpatient Medications on File Prior to Visit  Medication Sig Dispense Refill  . clonazePAM (KLONOPIN) 0.25 MG disintegrating tablet Take  0.25 mg 2 (two) times daily as needed by mouth for seizure.    Marland Kitchen. levothyroxine (SYNTHROID, LEVOTHROID) 125 MCG tablet Take 1 tablet (125 mcg total) by mouth daily. 30 tablet 5  . PARoxetine (PAXIL) 40 MG tablet Take 40 mg tablet daily 30 tablet 5   No current facility-administered medications on file prior to visit.    Family History  Problem Relation Age of Onset  . Bipolar disorder Mother   . Bipolar disorder Sister   . Bipolar disorder Brother   . Diabetes Maternal Grandmother   . Cancer Maternal Grandfather    Social History   Socioeconomic History  . Marital status: Single    Spouse name: Not on file  . Number of children: Not on file  . Years of education: Not on file  . Highest education level: Not on file  Social Needs  . Financial resource strain: Not on file  . Food insecurity - worry: Not on file  . Food insecurity - inability: Not on file  . Transportation needs - medical: Not on file  . Transportation needs - non-medical: Not on file  Occupational History  . Not on file  Tobacco Use  . Smoking status: Current Some Day Smoker    Packs/day: 0.25    Years: 18.00    Pack years: 4.50  . Smokeless tobacco: Former NeurosurgeonUser    Quit date: 11/15/2011  Substance and Sexual Activity  . Alcohol use: No  . Drug use: No  . Sexual activity: Not Currently    Birth control/protection: None    Comment:  not since she conceived  Other Topics Concern  . Not on file  Social History Narrative  . Not on file   Review of Systems  Constitutional: Positive for malaise/fatigue.  HENT: Negative.   Eyes: Negative.   Respiratory: Negative.   Cardiovascular: Negative.   Gastrointestinal: Negative.   Genitourinary: Negative.   Skin: Negative.   Neurological: Negative.   Psychiatric/Behavioral: Positive for depression and suicidal ideas. The patient is nervous/anxious and has insomnia.       Objective:   Vitals:   03/08/17 1349  BP: 114/66  Pulse: 74  Temp: 98.7 F (37.1 C)   SpO2: 98%   Physical Exam  Constitutional: She is oriented to person, place, and time. She appears well-developed and well-nourished.  HENT:  Head: Normocephalic and atraumatic.  Eyes: Conjunctivae and EOM are normal. Pupils are equal, round, and reactive to light.  Neck: Normal range of motion. Neck supple. No thyromegaly present.  Cardiovascular: Normal rate, regular rhythm, normal heart sounds and intact distal pulses.  Respiratory: Effort normal and breath sounds normal.  GI: Soft. Bowel sounds are normal. She exhibits no distension. There is no tenderness.  Musculoskeletal: Normal range of motion.  Lymphadenopathy:    She has no cervical adenopathy.  Neurological: She is alert and oriented to person, place, and time.  Skin: Skin is warm and dry.  Psychiatric: Her speech is normal and behavior is normal. Judgment and thought content normal. Her mood appears anxious. Cognition and memory are normal. She exhibits a depressed mood.  Very tearful    Lab Results  Component Value Date   WBC 8.8 12/02/2015   HGB 13.0 12/02/2015   HCT 38.5 12/02/2015   MCV 93.2 12/02/2015   PLT 289 12/02/2015   Lab Results  Component Value Date   CREATININE 0.97 12/02/2015   BUN 7 12/02/2015   NA 139 12/02/2015   K 3.6 12/02/2015   CL 105 12/02/2015   CO2 22 12/02/2015      Component Value Date/Time   CHOL 221 (H) 12/02/2015 1527   TRIG 139 12/02/2015 1527   HDL 54 12/02/2015 1527   CHOLHDL 4.1 12/02/2015 1527   VLDL 28 12/02/2015 1527   LDLCALC 139 (H) 12/02/2015 1527        Assessment and plan:  1. Hypothyroidism, unspecified type 2. Anxiety and depression 3. Fatigue, unspecified type 4. Depression, unspecified depression type Checking a TSH panel today.  For now continue levothyroxine 125 mcg daily.  For anxiety I am prescribing BuSpar 10 mg 3 times daily.  Resume Paxil at 60 mg daily for depression symptoms.  Tammy Green confirms today no active plan of suicide or plans to harm others.   Tammy Green has been getting multiple behavioral health resources to reach out to for additional counseling and psychiatric evaluation.  She is also been giving information regarding suicide prevention and suicide hotline. Encourage physical activity such as long walks to decrease anxiety and facilitate nighttime sleep.   Return in about 6 weeks (around 04/19/2017), or depression and anxiety follow-up.  The patient was given clear instructions to go to ER or return to medical center if symptoms don't improve, worsen or new problems develop. The patient verbalized understanding. The patient was told to call to get lab results if they haven't heard anything in the next week.     This note has been created with Education officer, [employer]agon speech recognition software and smart phrase technology. Any transcriptional errors are unintentional.

## 2017-03-08 NOTE — Patient Instructions (Addendum)
Continue therapy sessions. If you need any additional mental health resources, refer to information below:  Local mental health resources:  WorkplaceDirectory.at via smart phone or computer . By phone: [phone] or [phone]  [facility] -[phone]   [facility], [place], [place] [phone]    [facility] [place], [place] [phone]   Suicidal Feelings: How to Help Yourself Suicide is the taking of one's own life. If you feel as though life is getting too tough to handle and are thinking about suicide, get help right away. To get help:  Call your local emergency services (911 in the [place]  Call a suicide hotline to speak with a trained counselor who understands how you are feeling. The following is a list of suicide hotlines in the [place]. For a list of hotlines in [place], visit InkDistributor.it. ? 1-800-273-TALK 847-004-1016). ? 1-800-SUICIDE (514)781-6449). ? 725-403-3426. This is a hotline for Spanish speakers. ? 1-800-799-4TTY (610)578-2801). This is a hotline for TTY users. ? 1-866-4-U-TREVOR 820-790-2180). This is a hotline for lesbian, gay, bisexual, transgender, or questioning youth.  Contact a crisis center or a local suicide prevention center. To find a crisis center or suicide prevention center: ? Call your local hospital, clinic, community service organization, mental health center, social service provider, or health department. Ask for assistance in connecting to a crisis center. ? Visit https://www.patel-king.com/ for a list of crisis centers in the [place], or visit www.suicideprevention.ca/thinking-about-suicide/find-a-crisis-centre for a list of centers in [place].  Visit the following websites: ? National Suicide Prevention Lifeline:  www.suicidepreventionlifeline.org ? Hopeline: www.hopeline.com ? [employer] for Suicide Prevention: https://www.ayers.com/ ? [employer] (for lesbian, gay, bisexual, transgender, or questioning youth): www.thetrevorproject.org  How can I help myself feel better?  Promise yourself that you will not do anything drastic when you have suicidal feelings. Remember, there is hope. Many people have gotten through suicidal thoughts and feelings, and you will, too. You may have gotten through them before, and this proves that you can get through them again.  Let family, friends, teachers, or counselors know how you are feeling. Try not to isolate yourself from those who care about you. Remember, they will want to help you. Talk with someone every day, even if you do not feel sociable. Face-to-face conversation is best.  Call a mental health professional and see one regularly.  Visit your primary health care provider every year.  Eat a well-balanced diet, and space your meals so you eat regularly.  Get plenty of rest.  Avoid alcohol and drugs, and remove them from your home. They will only make you feel worse.  If you are thinking of taking a lot of medicine, give your medicine to someone who can give it to you one day at a time. If you are on antidepressants and are concerned you will overdose, let your health care provider know so he or she can give you safer medicines. Ask your mental health professional about the possible side effects of any medicines you are taking.  Remove weapons, poisons, knives, and anything else that could harm you from your home.  Try to stick to routines. Follow a schedule every day. Put self-care on your schedule.  Make a list of realistic goals, and cross them off when you achieve them. Accomplishments give a sense of worth.  Wait until you are feeling better before doing the things you find difficult or unpleasant.  Exercise if you are able. You will feel  better if you exercise for even a half hour each day.  Go out in the sun or into nature. This will help you recover from depression faster. If you have a favorite place to walk, go there.  Do the things that have always given you pleasure. Play your favorite music, read a good book, paint a picture, play your favorite instrument, or do anything else that takes your mind off your depression if it is safe to do.  Keep your living space well lit.  When you are feeling well, write yourself a letter about tips and support that you can read when you are not feeling well.  Remember that life's difficulties can be sorted out with help. Conditions can be treated. You can work on thoughts and strategies that serve you well. This information is not intended to replace advice given to you by your health care provider. Make sure you discuss any questions you have with your health care provider. Document Released: 10/17/2002 Document Revised: 12/10/2015 Document Reviewed: 08/07/2013 Elsevier Interactive Patient Education  2017 Elsevier Inc.   Living With Depression Everyone experiences occasional disappointment, sadness, and loss in their lives. When you are feeling down, blue, or sad for at least 2 weeks in a row, it may mean that you have depression. Depression can affect your thoughts and feelings, relationships, daily activities, and physical health. It is caused by changes in the way your brain functions. If you receive a diagnosis of depression, your health care provider will tell you which type of depression you have and what treatment options are available to you. If you are living with depression, there are ways to help you recover from it and also ways to prevent it from coming back. How to cope with lifestyle changes Coping with stress Stress is your body's reaction to life changes and events, both good and bad. Stressful situations may include:  Getting married.  The death of a  spouse.  Losing a job.  Retiring.  Having a baby.  Stress can last just a few hours or it can be ongoing. Stress can play a major role in depression, so it is important to learn both how to cope with stress and how to think about it differently. Talk with your health care provider or a counselor if you would like to learn more about stress reduction. He or she may suggest some stress reduction techniques, such as:  Music therapy. This can include creating music or listening to music. Choose music that you enjoy and that inspires you.  Mindfulness-based meditation. This kind of meditation can be done while sitting or walking. It involves being aware of your normal breaths, rather than trying to control your breathing.  Centering prayer. This is a kind of meditation that involves focusing on a spiritual word or phrase. Choose a word, phrase, or sacred image that is meaningful to you and that brings you peace.  Deep breathing. To do this, expand your stomach and inhale slowly through your nose. Hold your breath for 3-5 seconds, then exhale slowly, allowing your stomach muscles to relax.  Muscle relaxation. This involves intentionally tensing muscles then relaxing them.  Choose a stress reduction technique that fits your lifestyle and personality. Stress reduction techniques take time and practice to develop. Set aside 5-15 minutes a day to do them. Therapists can offer training in these techniques. The training may be covered by some insurance plans. Other things you can do to manage stress include:  Keeping a stress diary.  This can help you learn what triggers your stress and ways to control your response.  Understanding what your limits are and saying no to requests or events that lead to a schedule that is too full.  Thinking about how you respond to certain situations. You may not be able to control everything, but you can control how you react.  Adding humor to your life by watching  funny films or TV shows.  Making time for activities that help you relax and not feeling guilty about spending your time this way.  Medicines Your health care provider may suggest certain medicines if he or she feels that they will help improve your condition. Avoid using alcohol and other substances that may prevent your medicines from working properly (may interact). It is also important to:  Talk with your pharmacist or health care provider about all the medicines that you take, their possible side effects, and what medicines are safe to take together.  Make it your goal to take part in all treatment decisions (shared decision-making). This includes giving input on the side effects of medicines. It is best if shared decision-making with your health care provider is part of your total treatment plan.  If your health care provider prescribes a medicine, you may not notice the full benefits of it for 4-8 weeks. Most people who are treated for depression need to be on medicine for at least 6-12 months after they feel better. If you are taking medicines as part of your treatment, do not stop taking medicines without first talking to your health care provider. You may need to have the medicine slowly decreased (tapered) over time to decrease the risk of harmful side effects. Relationships Your health care provider may suggest family therapy along with individual therapy and drug therapy. While there may not be family problems that are causing you to feel depressed, it is still important to make sure your family learns as much as they can about your mental health. Having your family's support can help make your treatment successful. How to recognize changes in your condition Everyone has a different response to treatment for depression. Recovery from major depression happens when you have not had signs of major depression for two months. This may mean that you will start to:  Have more interest in doing  activities.  Feel less hopeless than you did 2 months ago.  Have more energy.  Overeat less often, or have better or improving appetite.  Have better concentration.  Your health care provider will work with you to decide the next steps in your recovery. It is also important to recognize when your condition is getting worse. Watch for these signs:  Having fatigue or low energy.  Eating too much or too little.  Sleeping too much or too little.  Feeling restless, agitated, or hopeless.  Having trouble concentrating or making decisions.  Having unexplained physical complaints.  Feeling irritable, angry, or aggressive.  Get help as soon as you or your family members notice these symptoms coming back. How to get support and help from others How to talk with friends and family members about your condition Talking to friends and family members about your condition can provide you with one way to get support and guidance. Reach out to trusted friends or family members, explain your symptoms to them, and let them know that you are working with a health care provider to treat your depression. Financial resources Not all insurance plans cover mental health care,  so it is important to check with your insurance carrier. If paying for co-pays or counseling services is a problem, search for a local or county mental health care center. They may be able to offer public mental health care services at low or no cost when you are not able to see a private health care provider. If you are taking medicine for depression, you may be able to get the generic form, which may be less expensive. Some makers of prescription medicines also offer help to patients who cannot afford the medicines they need. Follow these instructions at home:  Get the right amount and quality of sleep.  Cut down on using caffeine, tobacco, alcohol, and other potentially harmful substances.  Try to exercise, such as walking or  lifting small weights.  Take over-the-counter and prescription medicines only as told by your health care provider.  Eat a healthy diet that includes plenty of vegetables, fruits, whole grains, low-fat dairy products, and lean protein. Do not eat a lot of foods that are high in solid fats, added sugars, or salt.  Keep all follow-up visits as told by your health care provider. This is important. Contact a health care provider if:  You stop taking your antidepressant medicines, and you have any of these symptoms: ? Nausea. ? Headache. ? Feeling lightheaded. ? Chills and body aches. ? Not being able to sleep (insomnia).  You or your friends and family think your depression is getting worse. Get help right away if:  You have thoughts of hurting yourself or others. If you ever feel like you may hurt yourself or others, or have thoughts about taking your own life, get help right away. You can go to your nearest emergency department or call:  Your local emergency services (911 in the [place]  A suicide crisis helpline, such as the National Suicide Prevention Lifeline at 336 073 35671-514-439-4563. This is open 24-hours a day.  Summary  If you are living with depression, there are ways to help you recover from it and also ways to prevent it from coming back.  Work with your health care team to create a management plan that includes counseling, stress management techniques, and healthy lifestyle habits. This information is not intended to replace advice given to you by your health care provider. Make sure you discuss any questions you have with your health care provider. Document Released: 03/15/2016 Document Revised: 03/15/2016 Document Reviewed: 03/15/2016 Elsevier Interactive Patient Education  2018 [employer]   Major Depressive Disorder, Adult Major depressive disorder (MDD) is a mental health condition. MDD often makes you feel sad, hopeless, or helpless. MDD can also cause symptoms in your  body. MDD can affect your:  Work.  School.  Relationships.  Other normal activities.  MDD can range from mild to very bad. It may occur once (single episode MDD). It can also occur many times (recurrent MDD). The main symptoms of MDD often include:  Feeling sad, depressed, or irritable most of the time.  Loss of interest.  MDD symptoms also include:  Sleeping too much or too little.  Eating too much or too little.  A change in your weight.  Feeling tired (fatigue) or having low energy.  Feeling worthless.  Feeling guilty.  Trouble making decisions.  Trouble thinking clearly.  Thoughts of suicide or harming others.  Feeling weak.  Feeling agitated.  Keeping yourself from being around other people (isolation).  Follow these instructions at home: Activity  Do these things as told by your doctor: ?  Go back to your normal activities. ? Exercise regularly. ? Spend time outdoors. Alcohol  Talk with your doctor about how alcohol can affect your antidepressant medicines.  Do not drink alcohol. Or, limit how much alcohol you drink. ? This means no more than 1 drink a day for nonpregnant women and 2 drinks a day for men. One drink equals one of these:  12 oz of beer.  5 oz of wine.  1 oz of hard liquor. General instructions  Take over-the-counter and prescription medicines only as told by your doctor.  Eat a healthy diet.  Get plenty of sleep.  Find activities that you enjoy. Make time to do them.  Think about joining a support group. Your doctor may be able to suggest a group for you.  Keep all follow-up visits as told by your doctor. This is important. Where to find more information:  [employer] on Mental Illness: ? www.nami.org  [place]. [employer] of Mental Health: ? http://www.maynard.net/  National Suicide Prevention Lifeline: ? (930)054-0475. This is free, 24-hour help. Contact a doctor if:  Your symptoms get worse.  You have  new symptoms. Get help right away if:  You self-harm.  You see, hear, taste, smell, or feel things that are not present (hallucinate). If you ever feel like you may hurt yourself or others, or have thoughts about taking your own life, get help right away. You can go to your nearest emergency department or call:  Your local emergency services (911 in the [place]  A suicide crisis helpline, such as the National Suicide Prevention Lifeline: ? 469-164-9212. This is open 24 hours a day.  This information is not intended to replace advice given to you by your health care provider. Make sure you discuss any questions you have with your health care provider. Document Released: 03/24/2015 Document Revised: 12/28/2015 Document Reviewed: 12/28/2015 Elsevier Interactive Patient Education  2017 [employer].

## 2017-03-09 ENCOUNTER — Telehealth: Payer: Self-pay | Admitting: Family Medicine

## 2017-03-09 MED ORDER — LEVOTHYROXINE SODIUM 25 MCG PO TABS: 25.0000 ug | ORAL_TABLET | Freq: Every day | ORAL | 2 refills | Status: DC

## 2017-03-09 NOTE — Telephone Encounter (Signed)
Recent labs were unremarkable with the exception of your thyroid panel remains subtherapeutic.  Continue taking levothyroxine 125 mcg I am adding levothyroxine 25 mcg for a total of 150 mcg daily 30 minutes before breakfast every morning.  Levothyroxine was E prescribed to [facility].  Will repeat thyroid panel at follow-up visit in January.   Tammy PickKimberly S. Tiburcio PeaHarris, MSN, FNP-C [facility]  [address] [place], [place] [address] [phone]

## 2017-03-10 NOTE — Telephone Encounter (Signed)
Patient notified

## 2017-03-23 ENCOUNTER — Ambulatory Visit: Payer: Self-pay | Admitting: Family Medicine

## 2017-03-25 ENCOUNTER — Ambulatory Visit: Payer: Self-pay | Admitting: Family Medicine

## 2017-04-28 ENCOUNTER — Ambulatory Visit: Payer: Medicaid Other | Admitting: Family Medicine

## 2017-08-03 ENCOUNTER — Encounter: Payer: Self-pay | Admitting: Family Medicine

## 2017-08-03 ENCOUNTER — Ambulatory Visit (INDEPENDENT_AMBULATORY_CARE_PROVIDER_SITE_OTHER): Payer: Self-pay | Admitting: Family Medicine

## 2017-08-03 ENCOUNTER — Ambulatory Visit: Payer: Medicaid Other | Admitting: Family Medicine

## 2017-08-03 VITALS — BP 102/59 | HR 62 | Temp 97.8°F | Resp 14 | Ht 67.0 in | Wt 207.0 lb

## 2017-08-03 DIAGNOSIS — N3000 Acute cystitis without hematuria: Secondary | ICD-10-CM

## 2017-08-03 DIAGNOSIS — R300 Dysuria: Secondary | ICD-10-CM

## 2017-08-03 DIAGNOSIS — M545 Low back pain: Secondary | ICD-10-CM

## 2017-08-03 LAB — POCT URINALYSIS DIPSTICK
Bilirubin, UA: NEGATIVE
Glucose, UA: NEGATIVE
Nitrite, UA: POSITIVE
Protein, UA: NEGATIVE
Spec Grav, UA: 1.025
Urobilinogen, UA: 0.2 U/dL
pH, UA: 5.5

## 2017-08-03 MED ORDER — SULFAMETHOXAZOLE-TRIMETHOPRIM 800-160 MG PO TABS: 1.0000 | ORAL_TABLET | Freq: Two times a day (BID) | ORAL | 0 refills | Status: AC

## 2017-08-03 NOTE — Patient Instructions (Signed)
We will treat empirically for urinary tract infection. Will weight returns start Bactrim 800-160 mg every 12 hours for 10 days.  Increase water intake to 64 ounces per day.  Also, add pure cranberry juice to acidify urine.   Eat, eat, eat.   Recommend a moderate fat, low carbohydrate diet divided over 5-6 small meals, increase water intake to 6-8 glasses, and 150 minutes per week of cardiovascular exercise.    Breakfast: 2 slices of bacon and 1 egg Snack: Almonds, cottage cheese. Tuna, veggies Lunch: small salad with meat and high fat dressing Snack: veggies, almonds Dinner: meat and veggies   6-8 glasses of water. NO SODA, NO TEA, NO ALCOHOL, NO JUICE, NO FRUIT

## 2017-08-03 NOTE — Progress Notes (Signed)
Subjective:    Idil E Bogen is a [age] [address] [age][address]o. female with a history of hypothyroidism that presents complaining of  abnormal smelling urine, dysuria, frequency and incomplete bladder emptying for 3 days.  Patient denies back pain, congestion, cough, fever, headache, rhinitis, sorethroat and vaginal discharge.  Patient does not have a history of recurrent UTI.  Patient also does not a have a history of pyelonephritis. Past Medical History:  Diagnosis Date  . Anxiety   . Depression   . Kidney stones 2013  . Thyroid disease    Social History   Socioeconomic History  . Marital status: Single    Spouse name: Not on file  . Number of children: Not on file  . Years of education: Not on file  . Highest education level: Not on file  Occupational History  . Not on file  Social Needs  . Financial resource strain: Not on file  . Food insecurity:    Worry: Not on file    Inability: Not on file  . Transportation needs:    Medical: Not on file    Non-medical: Not on file  Tobacco Use  . Smoking status: Current Some Day Smoker    Packs/day: 0.25    Years: 18.00    Pack years: 4.50  . Smokeless tobacco: Former NeurosurgeonUser    Quit date: 11/15/2011  Substance and Sexual Activity  . Alcohol use: No  . Drug use: No  . Sexual activity: Not Currently    Birth control/protection: None    Comment: not since she conceived  Lifestyle  . Physical activity:    Days per week: Not on file    Minutes per session: Not on file  . Stress: Not on file  Relationships  . Social connections:    Talks on phone: Not on file    Gets together: Not on file    Attends religious service: Not on file    Active member of club or organization: Not on file    Attends meetings of clubs or organizations: Not on file    Relationship status: Not on file  . Intimate partner violence:    Fear of current or ex partner: Not on file    Emotionally abused: Not on file    Physically abused: Not on file    Forced sexual activity:  Not on file  Other Topics Concern  . Not on file  Social History Narrative  . Not on file   Immunization History  Administered Date(s) Administered  . Influenza,inj,Quad PF,6+ Mos 06/13/2014  . Tdap 11/25/2011    Review of Systems  Constitutional: Negative.   HENT: Negative.   Eyes: Negative.   Respiratory: Negative.   Cardiovascular: Negative.   Gastrointestinal: Negative.   Genitourinary: Positive for dysuria, frequency and urgency. Negative for flank pain and hematuria.  Musculoskeletal: Negative.   Skin: Negative.   Neurological: Negative.   Endo/Heme/Allergies: Negative.   Psychiatric/Behavioral: Negative.     Objective:    BP (!) 102/59 (BP Location: Left Arm, Patient Position: Sitting, Cuff Size: Normal)   Pulse 62   Temp 97.8 F (36.6 C) (Oral)   Resp 14   Ht 5\' 7"  (1.702 m)   Wt 207 lb (93.9 kg)   LMP 07/20/2017   SpO2 100%   BMI 32.42 kg/m  Physical Exam  Constitutional: She is oriented to person, place, and time. She appears well-developed and well-nourished.  Cardiovascular: Normal rate and regular rhythm.  Pulmonary/Chest: Effort normal and breath sounds normal.  Abdominal: Soft. Bowel sounds are normal.  Neurological: She is alert and oriented to person, place, and time.  Skin: Skin is warm.  Psychiatric: She has a normal mood and affect. Her behavior is normal. Judgment and thought content normal.      Assessment:     Acute cystitis    Plan:   Acute cystitis without hematuria Will treat empirically for urinary tract symptoms.  Will follow urine culture Patient advised to empty bladder completely and maintain adequate fluid intake - Urinalysis Dipstick - Urine Culture - sulfamethoxazole-trimethoprim (BACTRIM DS,SEPTRA DS) 800-160 MG tablet; Take 1 tablet by mouth 2 (two) times daily for 10 days.  Dispense: 20 tablet; Refill: 0  Dysuria - Urinalysis Dipstick - Urine Culture   RTC: Follow up in office as needed   Nolon Nations   MSN, FNP-C Patient Care [facility] [address] [place], [place] [address] [phone]

## 2017-08-05 LAB — URINE CULTURE

## 2017-08-08 ENCOUNTER — Telehealth: Payer: Self-pay

## 2017-08-08 NOTE — Telephone Encounter (Signed)
Called and spoke with patient, advised that urine culture showed e.coli growing and that she should complete antibiotic as prescribed. Patient verbalized understanding. Thanks!

## 2017-08-08 NOTE — Telephone Encounter (Signed)
-----   Message from Massie MaroonLachina M Hollis, [place] sent at 08/06/2017  7:24 AM EDT ----- Regarding: lab results Please inform patient that urine culture yielded E.coli. Complete antibiotic therapy as prescribed. Follow up in office as scheduled.   Nolon NationsLachina Moore Hollis  MSN, FNP-C Patient Care [facility] [address]  [place], [place] [id number] [phone]

## 2017-08-24 ENCOUNTER — Ambulatory Visit: Payer: Medicaid Other | Admitting: Family Medicine

## 2017-09-27 ENCOUNTER — Encounter (HOSPITAL_COMMUNITY): Payer: Self-pay | Admitting: Emergency Medicine

## 2017-09-27 ENCOUNTER — Emergency Department (HOSPITAL_COMMUNITY)
Admission: EM | Admit: 2017-09-27 | Discharge: 2017-09-27 | Disposition: A | Discharge status: Home / Self Care | Payer: Worker's Compensation | Attending: Emergency Medicine | Admitting: Emergency Medicine

## 2017-09-27 DIAGNOSIS — W269XXA Contact with unspecified sharp object(s), initial encounter: Secondary | ICD-10-CM | POA: Diagnosis not present

## 2017-09-27 DIAGNOSIS — Y9259 Other trade areas as the place of occurrence of the external cause: Secondary | ICD-10-CM | POA: Insufficient documentation

## 2017-09-27 DIAGNOSIS — S62525B Nondisplaced fracture of distal phalanx of left thumb, initial encounter for open fracture: Secondary | ICD-10-CM | POA: Diagnosis not present

## 2017-09-27 DIAGNOSIS — Y939 Activity, unspecified: Secondary | ICD-10-CM | POA: Diagnosis not present

## 2017-09-27 DIAGNOSIS — E039 Hypothyroidism, unspecified: Secondary | ICD-10-CM | POA: Diagnosis not present

## 2017-09-27 DIAGNOSIS — Z23 Encounter for immunization: Secondary | ICD-10-CM | POA: Diagnosis not present

## 2017-09-27 DIAGNOSIS — S61112A Laceration without foreign body of left thumb with damage to nail, initial encounter: Secondary | ICD-10-CM | POA: Diagnosis present

## 2017-09-27 DIAGNOSIS — Z79899 Other long term (current) drug therapy: Secondary | ICD-10-CM | POA: Insufficient documentation

## 2017-09-27 DIAGNOSIS — F17210 Nicotine dependence, cigarettes, uncomplicated: Secondary | ICD-10-CM | POA: Diagnosis not present

## 2017-09-27 DIAGNOSIS — Y990 Civilian activity done for income or pay: Secondary | ICD-10-CM | POA: Diagnosis not present

## 2017-09-27 MED ORDER — LIDOCAINE-EPINEPHRINE-TETRACAINE (LET) SOLUTION: 3.0000 mL | Freq: Once | NASAL | Status: DC

## 2017-09-27 MED ORDER — IBUPROFEN 800 MG PO TABS: 800.0000 mg | ORAL_TABLET | Freq: Three times a day (TID) | ORAL | 0 refills | Status: AC | PRN

## 2017-09-27 MED ORDER — CEPHALEXIN 500 MG PO CAPS: 500.0000 mg | ORAL_CAPSULE | Freq: Four times a day (QID) | ORAL | 0 refills | Status: AC

## 2017-09-27 MED ORDER — OXYCODONE HCL 5 MG PO TABS
5.0000 mg | ORAL_TABLET | Freq: Once | ORAL | Status: AC
Start: 2017-09-27 — End: 2017-09-27
  Administered 2017-09-27: 5 mg via ORAL
  Filled 2017-09-27: qty 1

## 2017-09-27 MED ORDER — TETANUS-DIPHTH-ACELL PERTUSSIS 5-2.5-18.5 LF-MCG/0.5 IM SUSP
0.5000 mL | Freq: Once | INTRAMUSCULAR | Status: AC
  Administered 2017-09-27: 0.5 mL via INTRAMUSCULAR
  Filled 2017-09-27: qty 0.5

## 2017-09-27 MED ORDER — BUPIVACAINE HCL (PF) 0.5 % IJ SOLN
10.0000 mL | Freq: Once | INTRAMUSCULAR | Status: AC
  Administered 2017-09-27: 10 mL
  Filled 2017-09-27: qty 10

## 2017-09-27 MED ORDER — LIDOCAINE HCL (PF) 1 % IJ SOLN
5.0000 mL | Freq: Once | INTRAMUSCULAR | Status: AC
  Administered 2017-09-27: 5 mL via INTRADERMAL
  Filled 2017-09-27: qty 5

## 2017-09-27 NOTE — ED Triage Notes (Addendum)
States cut her  Left thumb last night cutting lettuce and she states it bled all night, now dressing is stuck to lac she felt like she might pass out

## 2017-09-27 NOTE — Consult Note (Signed)
Reason for Consult:Left thumb fx Referring Physician: E Clifton JamesRees  Tammy Green is an 42 y.o. female.  HPI: Tammy Green was at work yesterday cutting lettuce when she sliced through her left thumb. It was bandaged but bled most of the night. When she went back to work today they insisted she come to be seen. 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  Past Medical History:  Diagnosis Date  . Anxiety   . Depression   . Kidney stones 2013  . Thyroid disease     Past Surgical History:  Procedure Laterality Date  . FEMUR FRACTURE SURGERY    . WISDOM TOOTH EXTRACTION      Family History  Problem Relation Age of Onset  . Bipolar disorder Mother   . Bipolar disorder Sister   . Bipolar disorder Brother   . Diabetes Maternal Grandmother   . Cancer Maternal Grandfather     Social History:  reports that she has been smoking.  She has a 4.50 pack-year smoking history. She quit smokeless tobacco use about 5 years ago. She reports that she does not drink alcohol or use drugs.  Allergies:  Allergies  Allergen Reactions  . Axid [Nizatidine] Hives  . Codeine Hives    Pt states she can take Percocet but can not take Vicodin    Medications: I have reviewed the patient's current medications.  No results found for this or any previous visit (from the past 48 hour(s)).  No results found.  Review of Systems  Constitutional: Negative for weight loss.  HENT: Negative for ear discharge, ear pain, hearing loss and tinnitus.   Eyes: Negative for blurred vision, double vision, photophobia and pain.  Respiratory: Negative for cough, sputum production and shortness of breath.   Cardiovascular: Negative for chest pain.  Gastrointestinal: Negative for abdominal pain, nausea and vomiting.  Genitourinary: Negative for dysuria, flank pain, frequency and urgency.  Musculoskeletal: Positive for joint pain (Left thumb). Negative for back pain, falls, myalgias  and neck pain.  Neurological: Negative for dizziness, tingling, sensory change, focal weakness, loss of consciousness and headaches.  Endo/Heme/Allergies: Does not bruise/bleed easily.  Psychiatric/Behavioral: Negative for depression, memory loss and substance abuse. The patient is not nervous/anxious.    Blood pressure 100/63, pulse 73, temperature 98.2 F (36.8 C), temperature source Oral, resp. rate 16, SpO2 99 %. Physical Exam  Constitutional: She appears well-developed and well-nourished. No distress.  HENT:  Head: Normocephalic and atraumatic.  Eyes: Conjunctivae are normal. Right eye exhibits no discharge. Left eye exhibits no discharge. No scleral icterus.  Neck: Normal range of motion.  Cardiovascular: Normal rate and regular rhythm.  Respiratory: Effort normal. No respiratory distress.  Musculoskeletal:  Left shoulder, elbow, wrist, digits- Laceration through ulnar side of thumbnail, severe TTP, no instability, no blocks to motion  Sens  Ax/R/M/U intact  Mot   Ax/ R/ PIN/ M/ AIN/ U intact  Rad 2+  Neurological: She is alert.  Skin: Skin is warm and dry. She is not diaphoretic.  Psychiatric: She has a normal mood and affect. Her behavior is normal.    Assessment/Plan: Left thumb laceration -- For repair by Tammy Green this afternoon. Should be able to go home afterwards.    Tammy CaldronMichael J. Amaiah Cristiano, PA-C Orthopedic Surgery [phone] 09/27/2017, 3:14 PM

## 2017-09-27 NOTE — Consult Note (Signed)
Reason for Consult:left thumb  laceration Referring Physician: ER staff  Tammy GlassmanSarah E Green is an 42 y.o. female.  HPI: Patient is 42 year old female who was injured last night while cutting some CABG at her job.  She has a nailbed nail plate injury with exposed distal phalanx and abnormal skin tissue.  She passed out today at work as she was bleeding so much she felt dizzy.   Unfortunately this injury 4824 hours old.  She denies other injury.  She denies neck back chest or abdominal pain.  I reviewed her findings at length.  She is a single mother and works 2 jobs.  She is nice lady.  I reviewed all issues at length she has a small is area of abrasion over the thumb and a large laceration about the distal tip.  There is no signs of dystrophy or infection and no signs of vascular compromise at present time.  Past Medical History:  Diagnosis Date  . Anxiety   . Depression   . Kidney stones 2013  . Thyroid disease     Past Surgical History:  Procedure Laterality Date  . FEMUR FRACTURE SURGERY    . WISDOM TOOTH EXTRACTION      Family History  Problem Relation Age of Onset  . Bipolar disorder Mother   . Bipolar disorder Sister   . Bipolar disorder Brother   . Diabetes Maternal Grandmother   . Cancer Maternal Grandfather     Social History:  reports that she has been smoking.  She has a 4.50 pack-year smoking history. She quit smokeless tobacco use about 5 years ago. She reports that she does not drink alcohol or use drugs.  Allergies:  Allergies  Allergen Reactions  . Axid [Nizatidine] Hives  . Codeine Hives    Pt states she can take Percocet but can not take Vicodin    Medications: I have reviewed the patient's current medications.  No results found for this or any previous visit (from the past 48 hour(s)).  No results found.  Review of Systems  Respiratory: Negative.   Cardiovascular: Negative.   Gastrointestinal: Negative.   Genitourinary: Negative.    Endo/Heme/Allergies: Negative.    Blood pressure 110/82, pulse (!) 56, temperature 98.2 F (36.8 C), temperature source Oral, resp. rate 16, SpO2 100 %. Physical Exam Left thumb distal tip injury with exposed distal phalanx and nailbed nail plate injury.  I reviewed this with her at length she will need a surgical reconstruction.  I have consented her for this.The patient is alert and oriented in no acute distress. The patient complains of pain in the affected upper extremity.  The patient is noted to have a normal HEENT exam. Lung fields show equal chest expansion and no shortness of breath. Abdomen exam is nontender without distention. Lower extremity examination does not show any fracture dislocation or blood clot symptoms. Pelvis is stable and the neck and back are stable and nontender. Assessment/Plan: We are planning surgery for your upper extremity. The risk and benefits of surgery to include risk of bleeding, infection, anesthesia,  damage to normal structures and failure of the surgery to accomplish its intended goals of relieving symptoms and restoring function have been discussed in detail. With this in mind we plan to proceed. I have specifically discussed with the patient the pre-and postoperative regime and the dos and don'ts and risk and benefits in great detail. Risk and benefits of surgery also include risk of dystrophy(CRPS), chronic nerve pain, failure of the healing process  to go onto completion and other inherent risks of surgery The relavent the pathophysiology of the disease/injury process, as well as the alternatives for treatment and postoperative course of action has been discussed in great detail with the patient who desires to proceed.  We will do everything in our power to help you (the patient) restore function to the upper extremity. It is a pleasure to see this patient today. 09/27/2017  6:19 PM  PATIENT:  Tammy Green    PRE-OPERATIVE DIAGNOSIS: Left thumb  distal tip injury with nailbed nail plate and bone exposure  POST-OPERATIVE DIAGNOSIS:  Same  PROCEDURE: Irrigation and debridement skin subtenons tissue and bone #1 #2 nail plate removal #3 nailbed repair #4 open treatment distal tip fracture  SURGEON:  Karen Chafe, MD  PHYSICIAN ASSISTANT:   ANESTHESIA:   Local block  PREOPERATIVE INDICATIONS:  Tammy Green is a  42 y.o. female with a diagnosis of   The risks benefits and alternatives were discussed with the patient preoperatively including but not limited to the risks of infection, bleeding, nerve injury, cardiopulmonary complications, the need for revision surgery, among others, and the patient was willing to proceed.   OPERATIVE PROCEDURE: Patient was seen sterilely prepped and draped in the sterile fashion after a intermetacarpal block was performed with lidocaine without epinephrine. Patient then had nail plate removed with Camelia Eng. Following this the patient went irrigation debridement and open fracture about the distal phalanx. This was an excisional debridement of a open fracture performed with curette knife blade and scissor. Following this patient underwent setting technique of the distal phalanx with open treatment of distal phalanx fracture. Following this the patient underwent meticulous repair of the nail bed with 4-0 loupe magnification and 6-0 chromic suture for the complex repair. Next the lateral nail fold was repaired. Following this Adaptic was placed under the eponychial fold to prevent nailbed adherent and the patient was dressed sterilely.  Thus the patient underwent irrigation and debridement of an open fracture no plate removal, nailbed repair, open treatment of distal phalanx fracture.   Keep bandage clean and dry.  Call for any problems.  No smoking.  Criteria for driving a car: you should be off your pain medicine for 7-8 hours, able to drive one handed(confident), thinking  clearly and feeling able in your judgement to drive. Continue elevation as it will decrease swelling.  If instructed by MD move your fingers within the confines of the bandage/splint.  Use ice if instructed by your MD. Call immediately for any sudden loss of feeling in your hand/arm or change in functional abilities of the extremity.  We recommend that you to take vitamin C 1000 mg a day to promote healing. We also recommend that if you require  pain medicine that you take a stool softener to prevent constipation as most pain medicines will have constipation side effects. We recommend either Peri-Colace or Senokot and recommend that you also consider adding MiraLAX as well to prevent the constipation affects from pain medicine if you are required to use them. These medicines are over the counter and may be purchased at a local pharmacy. A cup of yogurt and a probiotic can also be helpful during the recovery process as the medicines can disrupt your intestinal environment.  Dionne Ano Marlan Steward III 09/27/2017, 6:17 PM

## 2017-09-27 NOTE — ED Provider Notes (Addendum)
[facility]ENC[facility] Provider Note  CSN: [id number] Arrival date & time: 09/27/17  1211  History   Chief Complaint Chief Complaint  Patient presents with  . Extremity Laceration    HPI Tammy Green is a 42 y.o. female with a medical history of hypothyroidism, depression and anxiety who presented to the ED for left thumb laceration. She states she cut her thumb at work yesterday (~24 hrs) and has had it wrapped in gauze since then. Denies paresthesias, color changes, warmth or difficult moving it. She thinks she may have cut through the nail.   Past Medical History:  Diagnosis Date  . Anxiety   . Depression   . Kidney stones 2013  . Thyroid disease     Patient Active Problem List   Diagnosis Date Noted  . Hypothyroidism 08/07/2014  . Depression with anxiety 08/07/2014  . History of depression 06/18/2014  . History of anxiety state 06/18/2014  . Lump of skin of upper extremity 06/18/2014  . Kidney stones 03/30/2012    Past Surgical History:  Procedure Laterality Date  . FEMUR FRACTURE SURGERY    . WISDOM TOOTH EXTRACTION       OB History    Gravida  12   Para  3   Term  1   Preterm  1   AB  9   Living  3     SAB  7   TAB  2   Ectopic      Multiple      Live Births  3            Home Medications    Prior to Admission medications   Medication Sig Start Date End Date Taking? Authorizing Provider  busPIRone (BUSPAR) 10 MG tablet Take 1 tablet (10 mg total) 3 (three) times daily by mouth. 03/08/17   Bing NeighborsHarris, Kimberly S, FNP  cephALEXin (KEFLEX) 500 MG capsule Take 1 capsule (500 mg total) by mouth 4 (four) times daily for 5 days. 09/27/17 10/02/17  Mortis, Jerrel IvoryGabrielle I, PA-C  clonazePAM (KLONOPIN) 0.25 MG disintegrating tablet Take 0.25 mg 2 (two) times daily as needed by mouth for seizure.    [provider]  ibuprofen (ADVIL,MOTRIN) 800 MG tablet Take 1 tablet (800 mg total) by mouth every 8 (eight) hours as  needed for up to 7 days. 09/27/17 10/04/17  Mortis, Jerrel IvoryGabrielle I, PA-C  levothyroxine (SYNTHROID, LEVOTHROID) 125 MCG tablet Take 1 tablet (125 mcg total) daily by mouth. 03/08/17   Bing NeighborsHarris, Kimberly S, FNP  levothyroxine (SYNTHROID, LEVOTHROID) 25 MCG tablet Take 1 tablet (25 mcg total) daily before breakfast by mouth. Patient not taking: Reported on 08/03/2017 03/09/17   Bing NeighborsHarris, Kimberly S, FNP  PARoxetine (PAXIL) 30 MG tablet Take 2 tablets (60 mg total) every morning by mouth. Take two 30 tablets daily. 03/08/17   Bing NeighborsHarris, Kimberly S, FNP    Family History Family History  Problem Relation Age of Onset  . Bipolar disorder Mother   . Bipolar disorder Sister   . Bipolar disorder Brother   . Diabetes Maternal Grandmother   . Cancer Maternal Grandfather     Social History Social History   Tobacco Use  . Smoking status: Current Some Day Smoker    Packs/day: 0.25    Years: 18.00    Pack years: 4.50  . Smokeless tobacco: Former NeurosurgeonUser    Quit date: 11/15/2011  Substance Use Topics  . Alcohol use: No  . Drug use: No  Allergies   Axid [nizatidine] and Codeine   Review of Systems Review of Systems  Constitutional: Negative.   Musculoskeletal: Negative.   Skin: Positive for wound. Negative for color change and pallor.  Neurological: Positive for light-headedness. Negative for dizziness, tremors, weakness and numbness.  Hematological: Negative.  Does not bruise/bleed easily.     Physical Exam Updated Vital Signs BP 100/63 (BP Location: Right Arm)   Pulse 73   Temp 98.2 F (36.8 C) (Oral)   Resp 16   SpO2 99%   Physical Exam  Constitutional: Vital signs are normal. She appears well-developed and well-nourished.  Cardiovascular: Normal rate, regular rhythm and normal heart sounds. Exam reveals no decreased pulses.  Pulses:      Radial pulses are 2+ on the right side, and 2+ on the left side.  Pulmonary/Chest: Effort normal and breath sounds normal.  Musculoskeletal:        Left forearm: Normal.       Left hand: She exhibits laceration. She exhibits normal range of motion, no tenderness and normal capillary refill. Normal sensation noted. Normal strength noted.       Hands: Full active and passive ROM of left thumb.  Neurological: She is alert.  Skin: Skin is warm. Capillary refill takes less than 2 seconds. Laceration noted. No cyanosis. No pallor.  Left thumb laceration. Clotted blood visualized at site. Medial aspect of nail has been penetrated through to nail bed approx. 4mm deep.  Nursing note and vitals reviewed.    ED Treatments / Results  Labs (all labs ordered are listed, but only abnormal results are displayed) Labs Reviewed - No data to display  EKG None  Radiology No results found.  Procedures Procedures (including critical care time)  Medications Ordered in ED Medications  oxyCODONE (Oxy IR/ROXICODONE) immediate release tablet 5 mg (5 mg Oral Given 09/27/17 1401)  lidocaine (PF) (XYLOCAINE) 1 % injection 5 mL (5 mLs Intradermal Given by Other 09/27/17 1550)  Tdap (BOOSTRIX) injection 0.5 mL (0.5 mLs Intramuscular Given 09/27/17 1550)  bupivacaine (MARCAINE) 0.5 % injection 10 mL (10 mLs Infiltration Given by Other 09/27/17 1550)     Initial Impression / Assessment and Plan / ED Course  Triage vital signs and the nursing notes have been reviewed.  Pertinent labs & imaging results that were available during care of the patient were reviewed and considered in medical decision making (see chart for details).  Clinical Course as of Sep 28 1598  Tue Sep 27, 2017  1348 Roxicodone 5mg  x1 given in the ED for pain.   [GM]  1350 Patient still has full active and passive ROM and strength in affected thumb. Laceration goes through nail bed. Consult placed to hand surgeon who came and saw the patient. Dr. Ronalee Belts, PA-C will perform the laceration repair.   [GM]    Clinical Course User Index [GM] Mortis, Sharyon Medicus, PA-C   Patient  presents 1 day after cutting her thumb at work. The laceration penetrates the nail bed. Physical exam is reassuring as there is not any neurovascular compromise. She still has full active and passive ROM with full strength and sensation in the affected hand and finger. There are no signs of infection or cellulitis at this time. Hand surgery was consulted to perform repair. Tdap given in the ED and Rx for prophylactic antibiotics.  Final Clinical Impressions(s) / ED Diagnoses  1. Thumb Laceration. Received Tdap in the ED. Repair performed by hand surgeon, Dr. Cliffton Asters, who patient will follow-up  with as outpatient. Keflex given as prophylactic antibiotics. Ibuprofen Rx given for pain.  Dispo: Signed out to Felicie Morn, NP.  Final diagnoses:  Laceration of left thumb without foreign body with damage to nail, initial encounter    ED Discharge Orders        Ordered    cephALEXin (KEFLEX) 500 MG capsule  4 times daily     09/27/17 1600    ibuprofen (ADVIL,MOTRIN) 800 MG tablet  Every 8 hours PRN     09/27/17 1600        Mortis, [place] I, PA-C 09/27/17 1630    Mortis, [place] I, PA-C 09/27/17 1656    Tilden Fossa, MD 09/28/17 [phone]

## 2017-09-27 NOTE — ED Notes (Cosign Needed)
Patient seen by Dr. Amanda PeaGramig. Verbal order to discharge per his instructions.   Tammy Green, Tammy Flanary, NP 09/27/17 1753

## 2017-09-27 NOTE — ED Notes (Signed)
Gramig at bedside.  

## 2017-10-12 DIAGNOSIS — Z5189 Encounter for other specified aftercare: Secondary | ICD-10-CM | POA: Insufficient documentation

## 2017-10-12 DIAGNOSIS — S62525B Nondisplaced fracture of distal phalanx of left thumb, initial encounter for open fracture: Secondary | ICD-10-CM | POA: Insufficient documentation

## 2017-12-20 ENCOUNTER — Ambulatory Visit: Payer: Self-pay | Admitting: Family Medicine

## 2017-12-28 ENCOUNTER — Other Ambulatory Visit: Payer: Self-pay

## 2017-12-28 ENCOUNTER — Encounter (HOSPITAL_COMMUNITY): Payer: Self-pay | Admitting: Emergency Medicine

## 2017-12-28 ENCOUNTER — Emergency Department (HOSPITAL_COMMUNITY)
Admission: EM | Admit: 2017-12-28 | Discharge: 2017-12-28 | Disposition: A | Discharge status: Home / Self Care | Payer: Medicaid Other | Attending: Emergency Medicine | Admitting: Emergency Medicine

## 2017-12-28 DIAGNOSIS — F17210 Nicotine dependence, cigarettes, uncomplicated: Secondary | ICD-10-CM | POA: Insufficient documentation

## 2017-12-28 DIAGNOSIS — E039 Hypothyroidism, unspecified: Secondary | ICD-10-CM | POA: Insufficient documentation

## 2017-12-28 DIAGNOSIS — W57XXXA Bitten or stung by nonvenomous insect and other nonvenomous arthropods, initial encounter: Secondary | ICD-10-CM | POA: Insufficient documentation

## 2017-12-28 DIAGNOSIS — Y9384 Activity, sleeping: Secondary | ICD-10-CM | POA: Insufficient documentation

## 2017-12-28 DIAGNOSIS — L03311 Cellulitis of abdominal wall: Secondary | ICD-10-CM

## 2017-12-28 DIAGNOSIS — Y92018 Other place in single-family (private) house as the place of occurrence of the external cause: Secondary | ICD-10-CM | POA: Insufficient documentation

## 2017-12-28 DIAGNOSIS — Y999 Unspecified external cause status: Secondary | ICD-10-CM | POA: Insufficient documentation

## 2017-12-28 DIAGNOSIS — Z79899 Other long term (current) drug therapy: Secondary | ICD-10-CM | POA: Insufficient documentation

## 2017-12-28 MED ORDER — CEPHALEXIN 500 MG PO CAPS: 1000.0000 mg | ORAL_CAPSULE | Freq: Two times a day (BID) | ORAL | 0 refills | Status: DC

## 2017-12-28 MED ORDER — DOXYCYCLINE HYCLATE 100 MG PO CAPS: 100.0000 mg | ORAL_CAPSULE | Freq: Two times a day (BID) | ORAL | 0 refills | Status: DC

## 2017-12-28 NOTE — ED Provider Notes (Signed)
[facility] EMERGENCY DEPARTMENT Provider Note   CSN: [id number] Arrival date & time: 12/28/17  7829     History   Chief Complaint Chief Complaint  Patient presents with  . Insect Bite    HPI Tammy Green is a 42 y.o. female.  HPI   42 year old female with a painful area to her right lower abdominal wall.  First noticed when she woke up from a nap yesterday.  Progressing from that point.  No drainage.  No fevers or chills. Thinks she may have ben bitten by a spider.   Past Medical History:  Diagnosis Date  . Anxiety   . Depression   . Kidney stones 2013  . Thyroid disease     Patient Active Problem List   Diagnosis Date Noted  . Hypothyroidism 08/07/2014  . Depression with anxiety 08/07/2014  . History of depression 06/18/2014  . History of anxiety state 06/18/2014  . Lump of skin of upper extremity 06/18/2014  . Kidney stones 03/30/2012    Past Surgical History:  Procedure Laterality Date  . FEMUR FRACTURE SURGERY    . WISDOM TOOTH EXTRACTION       OB History    Gravida  12   Para  3   Term  1   Preterm  1   AB  9   Living  3     SAB  7   TAB  2   Ectopic      Multiple      Live Births  3            Home Medications    Prior to Admission medications   Medication Sig Start Date End Date Taking? Authorizing Provider  busPIRone (BUSPAR) 10 MG tablet Take 1 tablet (10 mg total) 3 (three) times daily by mouth. 03/08/17   Bing Neighbors, FNP  clonazePAM (KLONOPIN) 0.25 MG disintegrating tablet Take 0.25 mg 2 (two) times daily as needed by mouth for seizure.    [provider]  doxycycline (VIBRAMYCIN) 100 MG capsule Take 1 capsule (100 mg total) by mouth 2 (two) times daily. 12/28/17   Raeford Razor, MD  levothyroxine (SYNTHROID, LEVOTHROID) 125 MCG tablet Take 1 tablet (125 mcg total) daily by mouth. 03/08/17   Bing Neighbors, FNP  levothyroxine (SYNTHROID, LEVOTHROID) 25 MCG tablet Take 1 tablet (25  mcg total) daily before breakfast by mouth. Patient not taking: Reported on 08/03/2017 03/09/17   Bing Neighbors, FNP  PARoxetine (PAXIL) 30 MG tablet Take 2 tablets (60 mg total) every morning by mouth. Take two 30 tablets daily. 03/08/17   Bing Neighbors, FNP    Family History Family History  Problem Relation Age of Onset  . Bipolar disorder Mother   . Bipolar disorder Sister   . Bipolar disorder Brother   . Diabetes Maternal Grandmother   . Cancer Maternal Grandfather     Social History Social History   Tobacco Use  . Smoking status: Current Some Day Smoker    Packs/day: 0.25    Years: 18.00    Pack years: 4.50  . Smokeless tobacco: Former Neurosurgeon    Quit date: 11/15/2011  Substance Use Topics  . Alcohol use: No  . Drug use: No     Allergies   Axid [nizatidine] and Codeine   Review of Systems Review of Systems  All systems reviewed and negative, other than as noted in HPI.  Physical Exam Updated Vital Signs BP (!) 101/58  Pulse 67   Temp 98 F (36.7 C) (Oral)   Resp 18   SpO2 100%   Physical Exam  Constitutional: She appears well-developed and well-nourished. No distress.  HENT:  Head: Normocephalic and atraumatic.  Eyes: Conjunctivae are normal. Right eye exhibits no discharge. Left eye exhibits no discharge.  Neck: Neck supple.  Cardiovascular: Normal rate, regular rhythm and normal heart sounds. Exam reveals no gallop and no friction rub.  No murmur heard. Pulmonary/Chest: Effort normal and breath sounds normal. No respiratory distress.  Abdominal: Soft. She exhibits no distension. There is no tenderness.    Musculoskeletal: She exhibits no edema or tenderness.  Neurological: She is alert.  Skin: Skin is warm and dry.  Erythema, increased warmth and tenderness in the pictured area consistent with cellulitis.  No induration.  No abscess.  Psychiatric: She has a normal mood and affect. Her behavior is normal. Thought content normal.  Nursing  note and vitals reviewed.    ED Treatments / Results  Labs (all labs ordered are listed, but only abnormal results are displayed) Labs Reviewed - No data to display  EKG None  Radiology No results found.  Procedures Procedures (including critical care time)  Medications Ordered in ED Medications - No data to display   Initial Impression / Assessment and Plan / ED Course  I have reviewed the triage vital signs and the nursing notes.  Pertinent labs & imaging results that were available during my care of the patient were reviewed by me and considered in my medical decision making (see chart for details).     Small area of what I suspect this abdominal cellulitis.  Patient is generally well-appearing.  Afebrile.  No systemic symptoms.  I feel she is appropriate for outpatient treatment.  Final Clinical Impressions(s) / ED Diagnoses   Final diagnoses:  Abdominal wall cellulitis    ED Discharge Orders         Ordered    doxycycline (VIBRAMYCIN) 100 MG capsule  2 times daily     12/28/17 0388           Raeford Razor, MD 01/05/18 1622

## 2017-12-28 NOTE — ED Triage Notes (Signed)
Pt reports she thinks she was bitten by a spider on her lower right abdomen.

## 2017-12-29 ENCOUNTER — Other Ambulatory Visit: Payer: Self-pay

## 2017-12-29 ENCOUNTER — Emergency Department (HOSPITAL_COMMUNITY)
Admission: EM | Admit: 2017-12-29 | Discharge: 2017-12-29 | Disposition: A | Discharge status: Home / Self Care | Payer: Medicaid Other | Attending: Emergency Medicine | Admitting: Emergency Medicine

## 2017-12-29 ENCOUNTER — Encounter (HOSPITAL_COMMUNITY): Payer: Self-pay

## 2017-12-29 DIAGNOSIS — F17210 Nicotine dependence, cigarettes, uncomplicated: Secondary | ICD-10-CM | POA: Insufficient documentation

## 2017-12-29 DIAGNOSIS — L03311 Cellulitis of abdominal wall: Secondary | ICD-10-CM

## 2017-12-29 DIAGNOSIS — Z79899 Other long term (current) drug therapy: Secondary | ICD-10-CM | POA: Insufficient documentation

## 2017-12-29 DIAGNOSIS — E039 Hypothyroidism, unspecified: Secondary | ICD-10-CM | POA: Insufficient documentation

## 2017-12-29 MED ORDER — DOXYCYCLINE HYCLATE 100 MG PO TABS
100.0000 mg | ORAL_TABLET | Freq: Once | ORAL | Status: AC
  Administered 2017-12-29: 100 mg via ORAL
  Filled 2017-12-29: qty 1

## 2017-12-29 MED ORDER — DOXYCYCLINE HYCLATE 100 MG PO CAPS: 100.0000 mg | ORAL_CAPSULE | Freq: Two times a day (BID) | ORAL | 0 refills | Status: DC

## 2017-12-29 MED ORDER — OXYCODONE-ACETAMINOPHEN 5-325 MG PO TABS
1.0000 | ORAL_TABLET | Freq: Once | ORAL | Status: AC
  Administered 2017-12-29: 1 via ORAL
  Filled 2017-12-29: qty 1

## 2017-12-29 NOTE — ED Provider Notes (Signed)
[facility] EMERGENCY DEPARTMENT Provider Note   CSN: [id number] Arrival date & time: 12/29/17  1407     History   Chief Complaint Chief Complaint  Patient presents with  . Cellulitis    HPI Tammy Green is a 42 y.o. female.  Tammy Green is a 42 y.o. Female with a history of anxiety, depression, kidney stones and thyroid disease, who presents to the ED for evaluation of cellulitis.  Patient was seen in the ED yesterday morning and diagnosed with cellulitis over her right lower abdominal wall likely due to an insect bite.  Patient reports she has taken 4 doses of Keflex and reports redness has gotten bigger and increasingly painful.  She reports taking Tylenol and Aleve for pain with minimal relief has not tried anything else to treat her symptoms.  When she was seen yesterday she was given prescriptions for both doxycycline and Keflex and told to choose one she has been taking the Keflex has not filled the prescription for doxycycline.  She has not had any fevers.  No other new areas of redness, no nausea or vomiting.     Past Medical History:  Diagnosis Date  . Anxiety   . Depression   . Kidney stones 2013  . Thyroid disease     Patient Active Problem List   Diagnosis Date Noted  . Hypothyroidism 08/07/2014  . Depression with anxiety 08/07/2014  . History of depression 06/18/2014  . History of anxiety state 06/18/2014  . Lump of skin of upper extremity 06/18/2014  . Kidney stones 03/30/2012    Past Surgical History:  Procedure Laterality Date  . FEMUR FRACTURE SURGERY    . WISDOM TOOTH EXTRACTION       OB History    Gravida  12   Para  3   Term  1   Preterm  1   AB  9   Living  3     SAB  7   TAB  2   Ectopic      Multiple      Live Births  3            Home Medications    Prior to Admission medications   Medication Sig Start Date End Date Taking? Authorizing Provider  busPIRone (BUSPAR) 10 MG tablet Take 1  tablet (10 mg total) 3 (three) times daily by mouth. 03/08/17   Bing Neighbors, FNP  cephALEXin (KEFLEX) 500 MG capsule Take 2 capsules (1,000 mg total) by mouth 2 (two) times daily. 12/28/17   Raeford Razor, MD  clonazePAM (KLONOPIN) 0.25 MG disintegrating tablet Take 0.25 mg 2 (two) times daily as needed by mouth for seizure.    [provider]  doxycycline (VIBRAMYCIN) 100 MG capsule Take 1 capsule (100 mg total) by mouth 2 (two) times daily. 12/29/17   Dartha Lodge, PA-C  levothyroxine (SYNTHROID, LEVOTHROID) 125 MCG tablet Take 1 tablet (125 mcg total) daily by mouth. 03/08/17   Bing Neighbors, FNP  levothyroxine (SYNTHROID, LEVOTHROID) 25 MCG tablet Take 1 tablet (25 mcg total) daily before breakfast by mouth. Patient not taking: Reported on 08/03/2017 03/09/17   Bing Neighbors, FNP  PARoxetine (PAXIL) 30 MG tablet Take 2 tablets (60 mg total) every morning by mouth. Take two 30 tablets daily. 03/08/17   Bing Neighbors, FNP    Family History Family History  Problem Relation Age of Onset  . Bipolar disorder Mother   . Bipolar disorder Sister   .  Bipolar disorder Brother   . Diabetes Maternal Grandmother   . Cancer Maternal Grandfather     Social History Social History   Tobacco Use  . Smoking status: Current Some Day Smoker    Packs/day: 0.25    Years: 18.00    Pack years: 4.50  . Smokeless tobacco: Former Neurosurgeon    Quit date: 11/15/2011  Substance Use Topics  . Alcohol use: No  . Drug use: No     Allergies   Axid [nizatidine] and Codeine   Review of Systems Review of Systems  Constitutional: Negative for chills and fever.  Gastrointestinal: Positive for abdominal pain (localized over cellulitis). Negative for nausea and vomiting.  Skin: Positive for color change and rash. Negative for wound.  All other systems reviewed and are negative.    Physical Exam Updated Vital Signs BP 111/64   Pulse (!) 103   Temp 98.4 F (36.9 C) (Oral)   Resp  18   LMP 12/22/2017   SpO2 99%   Physical Exam  Constitutional: She appears well-developed and well-nourished. No distress.  HENT:  Head: Normocephalic and atraumatic.  Eyes: Right eye exhibits no discharge. Left eye exhibits no discharge.  Cardiovascular: Normal rate, regular rhythm, normal heart sounds and intact distal pulses.  Pulmonary/Chest: Effort normal and breath sounds normal. No respiratory distress.  Respirations equal and unlabored, patient able to speak in full sentences, lungs clear to auscultation bilaterally  Abdominal: Soft. Bowel sounds are normal. She exhibits no distension and no mass. There is tenderness. There is no guarding.  12 x 6 cm area of erythema in the right lower quadrant abdominal wall, tender to palpation directly over this area, abdomen is otherwise nontender to palpation.  Neurological: She is alert. Coordination normal.  Skin: Skin is warm and dry. She is not diaphoretic. There is erythema.  Psychiatric: She has a normal mood and affect. Her behavior is normal.  Nursing note and vitals reviewed.      ED Treatments / Results  Labs (all labs ordered are listed, but only abnormal results are displayed) Labs Reviewed - No data to display  EKG None  Radiology No results found.  Procedures Procedures (including critical care time)  Medications Ordered in ED Medications  doxycycline (VIBRA-TABS) tablet 100 mg (100 mg Oral Given 12/29/17 1557)  oxyCODONE-acetaminophen (PERCOCET/ROXICET) 5-325 MG per tablet 1 tablet (1 tablet Oral Given 12/29/17 1557)     Initial Impression / Assessment and Plan / ED Course  I have reviewed the triage vital signs and the nursing notes.  Pertinent labs & imaging results that were available during my care of the patient were reviewed by me and considered in my medical decision making (see chart for details).  Presents with abdominal cellulitis, was seen for the same yesterday and started treatment with Keflex,  despite 4 doses of Keflex redness has increased, will switch patient to doxycycline, first dose given here in the emergency department.  Provided doxycycline prescription and good Rx coupon.  Pain improved here in the emergency department patient initially mildly tachycardic at 103, this resolved and on my exam patient had normal pulse with regular rate.  Patient has been afebrile do not feel that she requires IV antibiotics at this time.  Area demarcated and return precautions discussed.  Feel patient is stable for discharge home at this time she expresses understanding and is in agreement with plan.  Final Clinical Impressions(s) / ED Diagnoses   Final diagnoses:  Abdominal wall cellulitis  ED Discharge Orders         Ordered    doxycycline (VIBRAMYCIN) 100 MG capsule  2 times daily     12/29/17 1554           Dartha Lodge, [place] 12/29/17 1633    Tegeler, Canary Brim, MD 12/29/17 [phone]

## 2017-12-29 NOTE — ED Triage Notes (Signed)
Pt presents with worsening redness and pain from possible spider bite yesterday.  Pt was seen here, began abx treatment and reports redness and pain to R abdomen is worse.

## 2017-12-29 NOTE — Discharge Instructions (Signed)
Please begin taking doxycycline twice daily, you can discontinue Keflex.  Continue with Aleve and Tylenol as needed for pain.  You can also try a cool compress over the area.  If cellulitis continues to worsen after 48 hours of doxycycline return to the emergency department for reevaluation, or if you develop fevers, severely worsened pain, drainage from the area or any other new or concerning symptoms.

## 2017-12-29 NOTE — ED Provider Notes (Signed)
Patient placed in Quick Look pathway, seen and evaluated   Chief Complaint: cellulitis  HPI:   Worsening redness and pain. Diagnosed with cellulitis in ER yesterday, has taken 4 keflex and redness has not improved, has expanded.   ROS: no fevers, chills. No h/o diabetes or immunosuppression.   Physical Exam:   Gen: No distress  Neuro: Awake and Alert  Skin: Warm    Focused Exam: Teary eyed. approx 12 x 6 cm area of erythema, tenderness, warmth with sharp borders. No focal fluctuance or signs of abscess.   Patient may need re-evaluation in 24-48 hours to ensure redness has improved.  No constitutional symptoms today, only received 1 day of abx.   Initiation of care has begun. The patient has been counseled on the process, plan, and necessity for staying for the completion/evaluation, and the remainder of the medical screening examination    Jerrell Mylar 12/29/17 1420    Loren Racer, MD 12/30/17 [phone]

## 2017-12-29 NOTE — ED Notes (Signed)
ED Provider at bedside. 

## 2017-12-30 ENCOUNTER — Emergency Department (HOSPITAL_COMMUNITY)
Admission: EM | Admit: 2017-12-30 | Discharge: 2017-12-30 | Disposition: A | Discharge status: Home / Self Care | Payer: Medicaid Other | Attending: Emergency Medicine | Admitting: Emergency Medicine

## 2017-12-30 ENCOUNTER — Encounter (HOSPITAL_COMMUNITY): Payer: Self-pay | Admitting: Emergency Medicine

## 2017-12-30 DIAGNOSIS — Z79899 Other long term (current) drug therapy: Secondary | ICD-10-CM | POA: Insufficient documentation

## 2017-12-30 DIAGNOSIS — F17200 Nicotine dependence, unspecified, uncomplicated: Secondary | ICD-10-CM | POA: Insufficient documentation

## 2017-12-30 DIAGNOSIS — L03311 Cellulitis of abdominal wall: Secondary | ICD-10-CM | POA: Insufficient documentation

## 2017-12-30 DIAGNOSIS — E039 Hypothyroidism, unspecified: Secondary | ICD-10-CM | POA: Insufficient documentation

## 2017-12-30 LAB — CBC WITH DIFFERENTIAL/PLATELET
ABS IMMATURE GRANULOCYTES: 0.1 10*3/uL (ref 0.0–0.1)
Basophils Absolute: 0.1 10*3/uL (ref 0.0–0.1)
Basophils Relative: 0 %
Eosinophils Absolute: 0.2 10*3/uL (ref 0.0–0.7)
Eosinophils Relative: 1 %
HEMATOCRIT: 39 % (ref 36.0–46.0)
Hemoglobin: 12.6 g/dL (ref 12.0–15.0)
IMMATURE GRANULOCYTES: 1 %
LYMPHS ABS: 1.5 10*3/uL (ref 0.7–4.0)
Lymphocytes Relative: 8 %
MCH: 30.7 pg (ref 26.0–34.0)
MCHC: 32.3 g/dL (ref 30.0–36.0)
MCV: 95.1 fL (ref 78.0–100.0)
Monocytes Absolute: 1 10*3/uL (ref 0.1–1.0)
Monocytes Relative: 5 %
NEUTROS ABS: 16.2 10*3/uL — AB (ref 1.7–7.7)
NEUTROS PCT: 85 %
PLATELETS: 280 10*3/uL (ref 150–400)
RBC: 4.1 MIL/uL (ref 3.87–5.11)
RDW: 13 % (ref 11.5–15.5)
WBC: 19 10*3/uL — ABNORMAL HIGH (ref 4.0–10.5)

## 2017-12-30 LAB — I-STAT BETA HCG BLOOD, ED (MC, WL, AP ONLY)

## 2017-12-30 LAB — COMPREHENSIVE METABOLIC PANEL
ALT: 31 U/L (ref 0–44)
ANION GAP: 7 (ref 5–15)
AST: 28 U/L (ref 15–41)
Albumin: 3 g/dL — ABNORMAL LOW (ref 3.5–5.0)
Alkaline Phosphatase: 80 U/L (ref 38–126)
BUN: 10 mg/dL (ref 6–20)
CHLORIDE: 109 mmol/L (ref 98–111)
CO2: 25 mmol/L (ref 22–32)
Calcium: 8.5 mg/dL — ABNORMAL LOW (ref 8.9–10.3)
Creatinine, Ser: 0.77 mg/dL (ref 0.44–1.00)
GFR calc non Af Amer: >60 mL/min (ref >60)
Glucose, Bld: 129 mg/dL — ABNORMAL HIGH (ref 70–99)
Potassium: 3.8 mmol/L (ref 3.5–5.1)
SODIUM: 141 mmol/L (ref 135–145)
Total Bilirubin: 0.7 mg/dL (ref 0.3–1.2)
Total Protein: 5.8 g/dL — ABNORMAL LOW (ref 6.5–8.1)

## 2017-12-30 LAB — I-STAT CG4 LACTIC ACID, ED
LACTIC ACID, VENOUS: 0.74 mmol/L (ref 0.5–1.9)
Lactic Acid, Venous: 0.83 mmol/L (ref 0.5–1.9)

## 2017-12-30 MED ORDER — OXYCODONE HCL 5 MG PO TABS
5.0000 mg | ORAL_TABLET | Freq: Once | ORAL | Status: AC
  Administered 2017-12-30: 5 mg via ORAL
  Filled 2017-12-30: qty 1

## 2017-12-30 MED ORDER — OXYCODONE HCL 5 MG PO TABS: 5.0000 mg | ORAL_TABLET | Freq: Four times a day (QID) | ORAL | 0 refills | Status: DC | PRN

## 2017-12-30 MED ORDER — CLINDAMYCIN PHOSPHATE 600 MG/50ML IV SOLN
600.0000 mg | Freq: Once | INTRAVENOUS | Status: AC
  Administered 2017-12-30: 600 mg via INTRAVENOUS
  Filled 2017-12-30: qty 50

## 2017-12-30 NOTE — ED Triage Notes (Signed)
Pt presents to ED for assessment of worsening redness to right lower abdomen after a probably spider bite.  Patient has been taking prescribed antibiotics (3 doses of keflex originally, and has taken two days of doxycycline and patient's redness continues to spread outside the circled area (skin marker).

## 2017-12-30 NOTE — Discharge Instructions (Signed)
Please read and follow all provided instructions.  Your diagnoses today include:  1. Cellulitis of abdominal wall     Tests performed today include:  Vital signs. See below for your results today.   Medications prescribed:   Oxycodone - narcotic pain medication  DO NOT drive or perform any activities that require you to be awake and alert because this medicine can make you drowsy.   Take any prescribed medications only as directed.   Home care instructions:  Follow any educational materials contained in this packet. Keep affected area above the level of your heart when possible. Wash area gently twice a day with warm soapy water. Do not apply alcohol or hydrogen peroxide. Cover the area if it draining or weeping.   Follow-up instructions: Return to the Emergency Department in 48 hours for a recheck.  Please follow-up with your primary care provider in the next 1 week for further evaluation of your symptoms.   Return instructions:  Return to the Emergency Department if you have:  Fever  Worsening symptoms  Worsening pain  Worsening swelling  Redness of the skin that moves away from the affected area, especially if it streaks away from the affected area   Any other emergent concerns  Your vital signs today were: BP 117/65 (BP Location: Right Arm)    Pulse 64    Temp 98.3 F (36.8 C) (Oral)    Resp 18    LMP 12/22/2017    SpO2 97%  If your blood pressure (BP) was elevated above 135/85 this visit, please have this repeated by your doctor within one month. --------------

## 2017-12-30 NOTE — ED Provider Notes (Signed)
[facility] EMERGENCY DEPARTMENT Provider Note   CSN: [id number] Arrival date & time: 12/30/17  1146     History   Chief Complaint Chief Complaint  Patient presents with  . Worsening infection    HPI Tammy Green is a 42 y.o. female.  Patient with history of anxiety presents to the emergency department with continued redness, erythema, and pain to her right lower abdomen.  Patient states that she saw a spider bite her several days ago.  She was seen in the emergency department on 12/28/2017 and was prescribed doxycycline and Keflex.  Per notes, patient filled the Keflex and re-presented yesterday with worsening symptoms.  She was started on doxycycline.  Patient represents today as symptoms have been worsening in the area of redness has been expanding.  She states that she has not had time to take her temperature at home but denies any shaking chills.  Area is very tender to palpation.  No active drainage.  She has had nausea but no vomiting.  Pain is worse with any palpation.  The onset of this condition was acute. The course is worse. Aggravating factors: none. Alleviating factors: none.       Past Medical History:  Diagnosis Date  . Anxiety   . Depression   . Kidney stones 2013  . Thyroid disease     Patient Active Problem List   Diagnosis Date Noted  . Hypothyroidism 08/07/2014  . Depression with anxiety 08/07/2014  . History of depression 06/18/2014  . History of anxiety state 06/18/2014  . Lump of skin of upper extremity 06/18/2014  . Kidney stones 03/30/2012    Past Surgical History:  Procedure Laterality Date  . FEMUR FRACTURE SURGERY    . WISDOM TOOTH EXTRACTION       OB History    Gravida  12   Para  3   Term  1   Preterm  1   AB  9   Living  3     SAB  7   TAB  2   Ectopic      Multiple      Live Births  3            Home Medications    Prior to Admission medications   Medication Sig Start Date End Date  Taking? Authorizing Provider  busPIRone (BUSPAR) 10 MG tablet Take 1 tablet (10 mg total) 3 (three) times daily by mouth. 03/08/17   Bing Neighbors, FNP  cephALEXin (KEFLEX) 500 MG capsule Take 2 capsules (1,000 mg total) by mouth 2 (two) times daily. 12/28/17   Raeford Razor, MD  clonazePAM (KLONOPIN) 0.25 MG disintegrating tablet Take 0.25 mg 2 (two) times daily as needed by mouth for seizure.    [provider]  doxycycline (VIBRAMYCIN) 100 MG capsule Take 1 capsule (100 mg total) by mouth 2 (two) times daily. 12/29/17   Dartha Lodge, PA-C  levothyroxine (SYNTHROID, LEVOTHROID) 125 MCG tablet Take 1 tablet (125 mcg total) daily by mouth. 03/08/17   Bing Neighbors, FNP  levothyroxine (SYNTHROID, LEVOTHROID) 25 MCG tablet Take 1 tablet (25 mcg total) daily before breakfast by mouth. Patient not taking: Reported on 08/03/2017 03/09/17   Bing Neighbors, FNP  PARoxetine (PAXIL) 30 MG tablet Take 2 tablets (60 mg total) every morning by mouth. Take two 30 tablets daily. 03/08/17   Bing Neighbors, FNP    Family History Family History  Problem Relation Age of Onset  .  Bipolar disorder Mother   . Bipolar disorder Sister   . Bipolar disorder Brother   . Diabetes Maternal Grandmother   . Cancer Maternal Grandfather     Social History Social History   Tobacco Use  . Smoking status: Current Some Day Smoker    Packs/day: 0.25    Years: 18.00    Pack years: 4.50  . Smokeless tobacco: Former Neurosurgeon    Quit date: 11/15/2011  Substance Use Topics  . Alcohol use: No  . Drug use: No     Allergies   Axid [nizatidine] and Codeine   Review of Systems Review of Systems  Constitutional: Negative for fever.  HENT: Negative for rhinorrhea and sore throat.   Eyes: Negative for redness.  Respiratory: Negative for cough.   Cardiovascular: Negative for chest pain.  Gastrointestinal: Positive for abdominal pain (wall pain) and nausea. Negative for diarrhea and vomiting.    Genitourinary: Negative for dysuria.  Musculoskeletal: Negative for myalgias.  Skin: Positive for rash.  Neurological: Negative for headaches.  Psychiatric/Behavioral: The patient is nervous/anxious.      Physical Exam Updated Vital Signs BP 114/64 (BP Location: Right Arm)   Pulse (!) 106   Temp 98.3 F (36.8 C) (Oral)   Resp 18   LMP 12/22/2017   SpO2 100%   Physical Exam  Constitutional: She appears well-developed and well-nourished.  HENT:  Head: Normocephalic and atraumatic.  Eyes: Conjunctivae are normal. Right eye exhibits no discharge. Left eye exhibits no discharge.  Neck: Normal range of motion. Neck supple.  Cardiovascular: Normal rate, regular rhythm and normal heart sounds.  Pulmonary/Chest: Effort normal and breath sounds normal.  Abdominal: Soft. There is tenderness. There is no rebound and no guarding.  Neurological: She is alert.  Skin: Skin is warm and dry.  Patient with large area of cellulitis on the right lower abdomen, slightly outside of the previously marked area.  No palpable abscess.  Psychiatric: She has a normal mood and affect.  Nursing note and vitals reviewed.    ED Treatments / Results  Labs (all labs ordered are listed, but only abnormal results are displayed) Labs Reviewed  COMPREHENSIVE METABOLIC PANEL - Abnormal; Notable for the following components:      Result Value   Glucose, Bld 129 (*)    Calcium 8.5 (*)    Total Protein 5.8 (*)    Albumin 3.0 (*)    All other components within normal limits  CBC WITH DIFFERENTIAL/PLATELET - Abnormal; Notable for the following components:   WBC 19.0 (*)    Neutro Abs 16.2 (*)    All other components within normal limits  I-STAT CG4 LACTIC ACID, ED  I-STAT BETA HCG BLOOD, ED ([facility], [facility], [facility] ONLY)  I-STAT CG4 LACTIC ACID, ED    EKG None  Radiology No results found.  Procedures Procedures (including critical care time)  Medications Ordered in ED Medications  oxyCODONE (Oxy  IR/ROXICODONE) immediate release tablet 5 mg (5 mg Oral Given 12/30/17 1346)  clindamycin (CLEOCIN) IVPB 600 mg (0 mg Intravenous Stopped 12/30/17 1538)     Initial Impression / Assessment and Plan / ED Course  I have reviewed the triage vital signs and the nursing notes.  Pertinent labs & imaging results that were available during my care of the patient were reviewed by me and considered in my medical decision making (see chart for details).     Patient seen and examined.  Reviewed previous ED visit notes.  Vital signs reviewed and are as  follows: BP 117/65 (BP Location: Right Arm)   Pulse 64   Temp 98.3 F (36.8 C) (Oral)   Resp 18   LMP 12/22/2017   SpO2 97%   EMERGENCY DEPARTMENT US SOFT TISSUE INTERPRETATION "Study: Limited Soft Tissue Ultrasound"  INDICATIONS: Pain and Soft tissue infection Multiple views of the body part were obtained in real-time with a multi-frequency linear probe  PERFORMED BY: Myself IMAGES ARCHIVED?: Yes SIDE:Right  BODY PART:Abdominal wall INTERPRETATION:  No abcess noted and Cellulitis present  3:58 PM plan is to give dose of IV clindamycin, continue home Keflex and doxycycline.  Discussed with patient that the next 24 to 48 hours will determine if the area of infection will improve.  If not improved, she may require hospitalization.  She does not really want to be in the hospital and is agreeable to continued monitor as outpatient.  Feel this is reasonable.  As patient is very uncomfortable, will give short course of pain medication. Patient counseled on use of narcotic pain medications. Counseled not to combine these medications with others containing tylenol. Urged not to drink alcohol, drive, or perform any other activities that requires focus while taking these medications. The patient verbalizes understanding and agrees with the plan.   Final Clinical Impressions(s) / ED Diagnoses   Final diagnoses:  Cellulitis of abdominal wall    Patient with cellulitis.  She was first seen 2 days ago.  She has been on Keflex followed by doxycycline.  Symptoms have gradually worsened.  No fevers documented.  Elevated white blood cell count here, no abscess demonstrated on bedside ultrasound.  I would not consider this outpatient failure at this point.  Patient appears well, nontoxic.  I am comfortable with this.  Home with plan as above.   ED Discharge Orders         Ordered    oxyCODONE (ROXICODONE) 5 MG immediate release tablet  Every 6 hours PRN     12/30/17 1533           Renne Crigler, PA-C 12/30/17 1600    Terrilee Files, MD 12/31/17 1540

## 2018-01-02 ENCOUNTER — Ambulatory Visit: Payer: Self-pay | Admitting: Family Medicine

## 2018-01-10 ENCOUNTER — Encounter: Payer: Self-pay | Admitting: Family Medicine

## 2018-01-10 ENCOUNTER — Ambulatory Visit (INDEPENDENT_AMBULATORY_CARE_PROVIDER_SITE_OTHER): Payer: Self-pay | Admitting: Family Medicine

## 2018-01-10 VITALS — BP 114/84 | HR 74 | Temp 98.1°F | Ht 67.0 in | Wt 209.0 lb

## 2018-01-10 DIAGNOSIS — F329 Major depressive disorder, single episode, unspecified: Secondary | ICD-10-CM

## 2018-01-10 DIAGNOSIS — Z09 Encounter for follow-up examination after completed treatment for conditions other than malignant neoplasm: Secondary | ICD-10-CM

## 2018-01-10 DIAGNOSIS — F32A Depression, unspecified: Secondary | ICD-10-CM

## 2018-01-10 DIAGNOSIS — R232 Flushing: Secondary | ICD-10-CM

## 2018-01-10 DIAGNOSIS — E039 Hypothyroidism, unspecified: Secondary | ICD-10-CM

## 2018-01-10 DIAGNOSIS — Z0000 Encounter for general adult medical examination without abnormal findings: Secondary | ICD-10-CM

## 2018-01-10 DIAGNOSIS — F419 Anxiety disorder, unspecified: Secondary | ICD-10-CM

## 2018-01-10 MED ORDER — BUSPIRONE HCL 10 MG PO TABS: 10.0000 mg | ORAL_TABLET | Freq: Three times a day (TID) | ORAL | 1 refills | Status: DC

## 2018-01-10 MED ORDER — PAROXETINE HCL 30 MG PO TABS: 60.0000 mg | ORAL_TABLET | ORAL | 3 refills | Status: DC

## 2018-01-10 MED ORDER — LEVOTHYROXINE SODIUM 125 MCG PO TABS: 125.0000 ug | ORAL_TABLET | Freq: Every day | ORAL | 1 refills | Status: DC

## 2018-01-10 MED FILL — busPIRone HCL 10 MG TABS: 10 | 30 days supply | Qty: 90 | Fill #0

## 2018-01-10 MED FILL — PARoxetine HCL 30 MG TABS: 30 | 30 days supply | Qty: 60 | Fill #0

## 2018-01-10 MED FILL — LEVOTHYROXINE 125 MCG TAB: 125 | 30 days supply | Qty: 30 | Fill #0

## 2018-01-10 NOTE — Progress Notes (Signed)
Hospital Follow Up   Subjective:    Patient ID: Tammy GlassmanSarah E Green, female    DOB: December 28, 1975, 42 y.o.   MRN: [id number]   Chief Complaint  Patient presents with  . Hospitalization Follow-up   HPI  Tammy Green is a 42 year old female with a past medical history of Thyroid Disease, Kidney Stones, Depression, and Anxiety. She is here today for follow up.   Current Status: Since her last office visit, she continues to have hot flashes. She has not been taking her medications for chronic conditions and she has also discontinued taking her anti-anxiety medications because of financial issues. States that her on-going spider bite infection has finally resolved. She has high anxiety r/t family issues. She is currently attending family counseling services to help resolve some family issues. She is very tearful at this office visit.   She denies fevers, chills, fatigue, recent infections, weight loss, and night sweats. She has not had any headaches, visual changes, dizziness, and falls. No chest pain, heart palpitations, cough and shortness of breath reported. No reports of GI problems such as nausea, vomiting, diarrhea, and constipation. She has no reports of blood in stools, dysuria and hematuria. No depression or anxiety, and denies suicidal ideations, homicidal ideations. She denies pain today.   Past Medical History:  Diagnosis Date  . Anxiety   . Depression   . Kidney stones 2013  . Thyroid disease     Family History  Problem Relation Age of Onset  . Bipolar disorder Mother   . Bipolar disorder Sister   . Bipolar disorder Brother   . Diabetes Maternal Grandmother   . Cancer Maternal Grandfather     Social History   Socioeconomic History  . Marital status: Single    Spouse name: Not on file  . Number of children: Not on file  . Years of education: Not on file  . Highest education level: Not on file  Occupational History  . Not on file  Social Needs  . Financial resource strain:  Not on file  . Food insecurity:    Worry: Not on file    Inability: Not on file  . Transportation needs:    Medical: Not on file    Non-medical: Not on file  Tobacco Use  . Smoking status: Current Some Day Smoker    Packs/day: 0.25    Years: 18.00    Pack years: 4.50  . Smokeless tobacco: Former NeurosurgeonUser    Quit date: 11/15/2011  Substance and Sexual Activity  . Alcohol use: No  . Drug use: No  . Sexual activity: Not Currently    Birth control/protection: None    Comment: not since she conceived  Lifestyle  . Physical activity:    Days per week: Not on file    Minutes per session: Not on file  . Stress: Not on file  Relationships  . Social connections:    Talks on phone: Not on file    Gets together: Not on file    Attends religious service: Not on file    Active member of club or organization: Not on file    Attends meetings of clubs or organizations: Not on file    Relationship status: Not on file  . Intimate partner violence:    Fear of current or ex partner: Not on file    Emotionally abused: Not on file    Physically abused: Not on file    Forced sexual activity: Not on file  Other  Topics Concern  . Not on file  Social History Narrative  . Not on file    Past Surgical History:  Procedure Laterality Date  . FEMUR FRACTURE SURGERY    . WISDOM TOOTH EXTRACTION      Immunization History  Administered Date(s) Administered  . Influenza,inj,Quad PF,6+ Mos 06/13/2014  . Tdap 11/25/2011, 09/27/2017   Current Meds  Medication Sig  . cephALEXin (KEFLEX) 500 MG capsule Take 2 capsules (1,000 mg total) by mouth 2 (two) times daily.  . clonazePAM (KLONOPIN) 0.25 MG disintegrating tablet Take 0.25 mg 2 (two) times daily as needed by mouth for seizure.  Marland Kitchen levothyroxine (SYNTHROID, LEVOTHROID) 125 MCG tablet Take 1 tablet (125 mcg total) daily by mouth.  Marland Kitchen PARoxetine (PAXIL) 30 MG tablet Take 2 tablets (60 mg total) every morning by mouth. Take two 30 tablets daily.     Allergies  Allergen Reactions  . Axid [Nizatidine] Hives  . Codeine Hives    Pt states she can take Percocet but can not take Vicodin  . Penicillin G Hives    BP 114/84 (BP Location: Left Arm, Patient Position: Sitting, Cuff Size: Large)   Pulse 74   Temp 98.1 F (36.7 C) (Oral)   Ht 5\' 7"  (1.702 m)   Wt 209 lb (94.8 kg)   LMP 12/22/2017   SpO2 100%   BMI 32.73 kg/m   Review of Systems  Constitutional: Negative.   HENT: Negative.   Eyes: Negative.   Respiratory: Negative.   Cardiovascular: Negative.   Gastrointestinal: Negative.   Endocrine: Negative.   Genitourinary: Negative.   Musculoskeletal: Negative.   Skin: Negative.   Allergic/Immunologic: Negative.   Neurological: Negative.   Hematological: Negative.   Psychiatric/Behavioral: Positive for agitation. The patient is nervous/anxious.    Objective:   Physical Exam  Constitutional: She is oriented to person, place, and time. She appears well-developed and well-nourished.  HENT:  Head: Normocephalic and atraumatic.  Right Ear: External ear normal.  Left Ear: External ear normal.  Nose: Nose normal.  Mouth/Throat: Oropharynx is clear and moist.  Eyes: Pupils are equal, round, and reactive to light. Conjunctivae and EOM are normal.  Neck: Normal range of motion. Neck supple.  Cardiovascular: Normal rate, regular rhythm, normal heart sounds and intact distal pulses.  Pulmonary/Chest: Effort normal and breath sounds normal.  Abdominal: Soft. Bowel sounds are normal.  Musculoskeletal: Normal range of motion.  Neurological: She is alert and oriented to person, place, and time.  Skin: Skin is warm and dry. Capillary refill takes less than 2 seconds.  Psychiatric: Judgment and thought content normal.  Anxious, agitated, tearful today.   Nursing note and vitals reviewed.  Assessment & Plan:   1. Anxiety and depression We will refill Paxil and Buspar as prescribed. We will refer her to Psychiatry as she would  like individual counseling.  - PARoxetine (PAXIL) 30 MG tablet; Take 2 tablets (60 mg total) by mouth every morning. Take two 30 tablets daily.  Dispense: 60 tablet; Refill: 3 - Ambulatory referral to Psychiatry - busPIRone (BUSPAR) 10 MG tablet; Take 1 tablet (10 mg total) by mouth 3 (three) times daily.  Dispense: 90 tablet; Refill: 1  2. Hypothyroidism, unspecified type She will resume Synthroid 125 mcg as prescribed and re-assess TSH at next office visit. Monitor.  - levothyroxine (SYNTHROID, LEVOTHROID) 125 MCG tablet; Take 1 tablet (125 mcg total) by mouth daily.  Dispense: 90 tablet; Refill: 1  3. Hot flashes Stable.   4. Healthcare maintenance -  POCT urinalysis dipstick  5. Follow up He will follow up in 1 months.  Meds ordered this encounter  Medications  . PARoxetine (PAXIL) 30 MG tablet    Sig: Take 2 tablets (60 mg total) by mouth every morning. Take two 30 tablets daily.    Dispense:  60 tablet    Refill:  3  . levothyroxine (SYNTHROID, LEVOTHROID) 125 MCG tablet    Sig: Take 1 tablet (125 mcg total) by mouth daily.    Dispense:  90 tablet    Refill:  1  . busPIRone (BUSPAR) 10 MG tablet    Sig: Take 1 tablet (10 mg total) by mouth 3 (three) times daily.    Dispense:  90 tablet    Refill:  1    Raliegh Ip,  MSN, FNP-C Patient [facility] [address] [place], [place] [address] [phone]

## 2018-02-02 ENCOUNTER — Telehealth: Payer: Self-pay

## 2018-02-02 NOTE — Telephone Encounter (Signed)
Patient will make appointment on 10/15 at 9am.

## 2018-02-07 ENCOUNTER — Ambulatory Visit: Payer: Self-pay | Admitting: Family Medicine

## 2018-03-16 MED FILL — LEVOTHYROXINE 125 MCG TAB: 125 | 30 days supply | Qty: 30 | Fill #1

## 2018-03-16 MED FILL — PARoxetine HCL 30 MG TABS: 30 | 30 days supply | Qty: 60 | Fill #1

## 2018-04-28 ENCOUNTER — Encounter: Payer: Self-pay | Admitting: Family Medicine

## 2018-04-28 ENCOUNTER — Ambulatory Visit (INDEPENDENT_AMBULATORY_CARE_PROVIDER_SITE_OTHER): Payer: Self-pay | Admitting: Family Medicine

## 2018-04-28 VITALS — BP 112/76 | HR 70 | Temp 98.1°F | Ht 67.0 in | Wt 210.0 lb

## 2018-04-28 DIAGNOSIS — E039 Hypothyroidism, unspecified: Secondary | ICD-10-CM

## 2018-04-28 DIAGNOSIS — R8290 Unspecified abnormal findings in urine: Secondary | ICD-10-CM

## 2018-04-28 DIAGNOSIS — Z09 Encounter for follow-up examination after completed treatment for conditions other than malignant neoplasm: Secondary | ICD-10-CM

## 2018-04-28 DIAGNOSIS — R300 Dysuria: Secondary | ICD-10-CM

## 2018-04-28 DIAGNOSIS — R319 Hematuria, unspecified: Secondary | ICD-10-CM

## 2018-04-28 DIAGNOSIS — N390 Urinary tract infection, site not specified: Secondary | ICD-10-CM

## 2018-04-28 LAB — POCT URINALYSIS DIP (MANUAL ENTRY)
Bilirubin, UA: NEGATIVE
Glucose, UA: NEGATIVE mg/dL
Ketones, POC UA: NEGATIVE mg/dL
Nitrite, UA: POSITIVE — AB
Protein Ur, POC: 100 mg/dL — AB
Spec Grav, UA: 1.025 (ref 1.010–1.025)
Urobilinogen, UA: 1 E.U./dL
pH, UA: 6.5 (ref 5.0–8.0)

## 2018-04-28 MED ORDER — LEVOTHYROXINE SODIUM 125 MCG PO TABS: 125.0000 ug | ORAL_TABLET | Freq: Every day | ORAL | 1 refills | Status: DC

## 2018-04-28 MED ORDER — NITROFURANTOIN MONOHYD MACRO 100 MG PO CAPS: 100.0000 mg | ORAL_CAPSULE | Freq: Two times a day (BID) | ORAL | 0 refills | Status: AC

## 2018-04-28 NOTE — Progress Notes (Signed)
Sick Visit  Subjective:    Patient ID: Tammy Green, female    DOB: 05-12-1975, 43 y.o.   MRN: [id number]  Chief Complaint  Patient presents with  . Dysuria  . Urinary Frequency   HPI  Tammy Green is a 43 year old female with a past medical history of Thyroid Disease, Kidney Stones, Depression, and Anxiety. She is here today for a sick visit.   Current Status: Since her last office visit, she is doing well with c/o of dysuria, burning, and odor X 1 week. She has been taking AZO with no relief. Her anxiety is increased since the holidays, which she no longer sees Therapist, sports at [employer].   She denies fevers, chills, fatigue, recent infections, weight loss, and night sweats. She has not had any headaches, visual changes, dizziness, and falls. No chest pain, heart palpitations, cough and shortness of breath reported. No reports of GI problems such as nausea, vomiting, diarrhea, and constipation. She has no reports of blood in stools, dysuria and hematuria. No depression or anxiety, and denies suicidal ideations, homicidal ideations, or auditory hallucinations. She denies pain today.   Review of Systems  Constitutional: Negative.   HENT: Negative.   Eyes: Negative.   Respiratory: Negative.   Cardiovascular: Negative.   Gastrointestinal: Negative.   Endocrine: Negative.   Genitourinary: Negative.   Musculoskeletal: Negative.   Skin: Negative.   Allergic/Immunologic: Negative.   Neurological: Positive for headaches (Occasional ).  Hematological: Negative.   Psychiatric/Behavioral: Negative.    Objective:   Physical Exam Vitals signs and nursing note reviewed.  Constitutional:      Appearance: Normal appearance. She is normal weight.  HENT:     Head: Normocephalic and atraumatic.     Right Ear: External ear normal.     Left Ear: External ear normal.     Nose: Nose normal.     Mouth/Throat:     Mouth: Mucous membranes are moist.     Pharynx: Oropharynx is clear.  Eyes:   Conjunctiva/sclera: Conjunctivae normal.  Neck:     Musculoskeletal: Normal range of motion and neck supple.  Cardiovascular:     Rate and Rhythm: Normal rate and regular rhythm.     Pulses: Normal pulses.     Heart sounds: Normal heart sounds.  Pulmonary:     Effort: Pulmonary effort is normal.     Breath sounds: Normal breath sounds.  Abdominal:     General: Bowel sounds are normal.     Palpations: Abdomen is soft.  Musculoskeletal: Normal range of motion.  Skin:    General: Skin is warm and dry.     Capillary Refill: Capillary refill takes less than 2 seconds.  Neurological:     General: No focal deficit present.     Mental Status: She is alert and oriented to person, place, and time.  Psychiatric:        Mood and Affect: Mood normal.        Behavior: Behavior normal.        Thought Content: Thought content normal.        Judgment: Judgment normal.    Assessment & Plan:   1. Dysuria - POCT urinalysis dipstick  2. Hypothyroidism, unspecified type - levothyroxine (SYNTHROID, LEVOTHROID) 125 MCG tablet; Take 1 tablet (125 mcg total) by mouth daily.  Dispense: 90 tablet; Refill: 1  3. Abnormal urinalysis Urine culture pending. We will also swab patient today for further assessment.  - Urine Culture  4. Urinary tract infection  with hematuria, site unspecified We will initiate Macrobid today.  - nitrofurantoin, macrocrystal-monohydrate, (MACROBID) 100 MG capsule; Take 1 capsule (100 mg total) by mouth 2 (two) times daily for 7 days.  Dispense: 14 capsule; Refill: 0  5. Follow up She will follow up in 2 weeks. We will draw labs including Thyroid Levels and referral to Psychiatry at next office visit.   Meds ordered this encounter  Medications  . levothyroxine (SYNTHROID, LEVOTHROID) 125 MCG tablet    Sig: Take 1 tablet (125 mcg total) by mouth daily.    Dispense:  90 tablet    Refill:  1  . nitrofurantoin, macrocrystal-monohydrate, (MACROBID) 100 MG capsule    Sig:  Take 1 capsule (100 mg total) by mouth 2 (two) times daily for 7 days.    Dispense:  14 capsule    Refill:  0   Raliegh Ip,  MSN, FNP-C Patient [facility] [address] [place], [place] [address] [phone]

## 2018-04-28 NOTE — Patient Instructions (Signed)
Nitrofurantoin tablets or capsules What is this medicine? NITROFURANTOIN (nye troe fyoor AN toyn) is an antibiotic. It is used to treat urinary tract infections. This medicine may be used for other purposes; ask your health care provider or pharmacist if you have questions. COMMON BRAND NAME(S): Macrobid, Macrodantin, Urotoin What should I tell my health care provider before I take this medicine? They need to know if you have any of these conditions: -anemia -diabetes -glucose-6-phosphate dehydrogenase deficiency -kidney disease -liver disease -lung disease -other chronic illness -an unusual or allergic reaction to nitrofurantoin, other antibiotics, other medicines, foods, dyes or preservatives -pregnant or trying to get pregnant -breast-feeding How should I use this medicine? Take this medicine by mouth with a glass of water. Follow the directions on the prescription label. Take this medicine with food or milk. Take your doses at regular intervals. Do not take your medicine more often than directed. Do not stop taking except on your doctor's advice. Talk to your pediatrician regarding the use of this medicine in children. While this drug may be prescribed for selected conditions, precautions do apply. Overdosage: If you think you have taken too much of this medicine contact a poison control center or emergency room at once. NOTE: This medicine is only for you. Do not share this medicine with others. What if I miss a dose? If you miss a dose, take it as soon as you can. If it is almost time for your next dose, take only that dose. Do not take double or extra doses. What may interact with this medicine? -antacids containing magnesium trisilicate -probenecid -quinolone antibiotics like ciprofloxacin, lomefloxacin, norfloxacin and ofloxacin -sulfinpyrazone This list may not describe all possible interactions. Give your health care provider a list of all the medicines, herbs,  non-prescription drugs, or dietary supplements you use. Also tell them if you smoke, drink alcohol, or use illegal drugs. Some items may interact with your medicine. What should I watch for while using this medicine? Tell your doctor or health care professional if your symptoms do not improve or if you get new symptoms. Drink several glasses of water a day. If you are taking this medicine for a long time, visit your doctor for regular checks on your progress. If you are diabetic, you may get a false positive result for sugar in your urine with certain brands of urine tests. Check with your doctor. What side effects may I notice from receiving this medicine? Side effects that you should report to your doctor or health care professional as soon as possible: -allergic reactions like skin rash or hives, swelling of the face, lips, or tongue -chest pain -cough -difficulty breathing -dizziness, drowsiness -fever or infection -joint aches or pains -pale or blue-tinted skin -redness, blistering, peeling or loosening of the skin, including inside the mouth -tingling, burning, pain, or numbness in hands or feet -unusual bleeding or bruising -unusually weak or tired -yellowing of eyes or skin Side effects that usually do not require medical attention (report to your doctor or health care professional if they continue or are bothersome): -dark urine -diarrhea -headache -loss of appetite -nausea or vomiting -temporary hair loss This list may not describe all possible side effects. Call your doctor for medical advice about side effects. You may report side effects to FDA at 1-800-FDA-1088. Where should I keep my medicine? Keep out of the reach of children. Store at room temperature between 15 and 30 degrees C (59 and 86 degrees F). Protect from light. Throw away any unused  medicine after the expiration date. NOTE: This sheet is a summary. It may not cover all possible information. If you have  questions about this medicine, talk to your doctor, pharmacist, or health care provider.  2019 Elsevier/Gold Standard (2007-11-01 15:56:47) Urinary Tract Infection, Adult A urinary tract infection (UTI) is an infection of any part of the urinary tract. The urinary tract includes:  The kidneys.  The ureters.  The bladder.  The urethra. These organs make, store, and get rid of pee (urine) in the body. What are the causes? This is caused by germs (bacteria) in your genital area. These germs grow and cause swelling (inflammation) of your urinary tract. What increases the risk? You are more likely to develop this condition if:  You have a small, thin tube (catheter) to drain pee.  You cannot control when you pee or poop (incontinence).  You are female, and: ? You use these methods to prevent pregnancy: ? A medicine that kills sperm (spermicide). ? A device that blocks sperm (diaphragm). ? You have low levels of a female hormone (estrogen). ? You are pregnant.  You have genes that add to your risk.  You are sexually active.  You take antibiotic medicines.  You have trouble peeing because of: ? A prostate that is bigger than normal, if you are female. ? A blockage in the part of your body that drains pee from the bladder (urethra). ? A kidney stone. ? A nerve condition that affects your bladder (neurogenic bladder). ? Not getting enough to drink. ? Not peeing often enough.  You have other conditions, such as: ? Diabetes. ? A weak disease-fighting system (immune system). ? Sickle cell disease. ? Gout. ? Injury of the spine. What are the signs or symptoms? Symptoms of this condition include:  Needing to pee right away (urgently).  Peeing often.  Peeing small amounts often.  Pain or burning when peeing.  Blood in the pee.  Pee that smells bad or not like normal.  Trouble peeing.  Pee that is cloudy.  Fluid coming from the vagina, if you are female.  Pain in  the belly or lower back. Other symptoms include:  Throwing up (vomiting).  No urge to eat.  Feeling mixed up (confused).  Being tired and grouchy (irritable).  A fever.  Watery poop (diarrhea). How is this treated? This condition may be treated with:  Antibiotic medicine.  Other medicines.  Drinking enough water. Follow these instructions at home:  Medicines  Take over-the-counter and prescription medicines only as told by your doctor.  If you were prescribed an antibiotic medicine, take it as told by your doctor. Do not stop taking it even if you start to feel better. General instructions  Make sure you: ? Pee until your bladder is empty. ? Do not hold pee for a long time. ? Empty your bladder after sex. ? Wipe from front to back after pooping if you are a female. Use each tissue one time when you wipe.  Drink enough fluid to keep your pee pale yellow.  Keep all follow-up visits as told by your doctor. This is important. Contact a doctor if:  You do not get better after 1-2 days.  Your symptoms go away and then come back. Get help right away if:  You have very bad back pain.  You have very bad pain in your lower belly.  You have a fever.  You are sick to your stomach (nauseous).  You are throwing up. Summary  pale yellow.  Keep all follow-up visits as told by your doctor. This is important.  Contact a doctor if:  You do not get better after 1-2 days.  Your symptoms go away and then come back.  Get help right away if:  You have very bad back pain.  You have very bad pain in your lower belly.  You have a fever.  You are sick to your stomach (nauseous).  You are throwing up.  Summary  A urinary tract infection (UTI) is an infection of any part of the urinary tract.  This condition is caused by germs in your genital area.  There are many risk factors for a UTI. These include having a small, thin tube to drain pee and not being able to control when you pee or poop.  Treatment includes antibiotic medicines for germs.  Drink enough fluid to keep your pee pale yellow.  This information is not intended to replace advice given to you by your health care provider. Make sure you discuss any questions you have with your health care provider.  Document Released: 09/29/2007 Document Revised: 10/20/2017 Document Reviewed: 10/20/2017  Elsevier Interactive Patient Education  2019 Elsevier Inc.

## 2018-04-30 ENCOUNTER — Other Ambulatory Visit: Payer: Self-pay | Admitting: Family Medicine

## 2018-04-30 DIAGNOSIS — B9689 Other specified bacterial agents as the cause of diseases classified elsewhere: Secondary | ICD-10-CM

## 2018-04-30 DIAGNOSIS — A599 Trichomoniasis, unspecified: Secondary | ICD-10-CM

## 2018-04-30 DIAGNOSIS — N760 Acute vaginitis: Secondary | ICD-10-CM

## 2018-04-30 LAB — NUSWAB VAGINITIS PLUS (VG+)
Atopobium vaginae: HIGH Score — AB
Candida albicans, NAA: NEGATIVE
Candida glabrata, NAA: NEGATIVE
Chlamydia trachomatis, NAA: NEGATIVE
Megasphaera 1: HIGH Score — AB
Neisseria gonorrhoeae, NAA: NEGATIVE
Trich vag by NAA: POSITIVE — AB

## 2018-04-30 LAB — URINE CULTURE

## 2018-04-30 MED ORDER — METRONIDAZOLE 500 MG PO TABS: 2000.0000 mg | ORAL_TABLET | Freq: Once | ORAL | 0 refills | Status: AC

## 2018-05-01 ENCOUNTER — Telehealth: Payer: Self-pay

## 2018-05-01 NOTE — Telephone Encounter (Signed)
-----   Message from Kallie Locks, FNP sent at 04/30/2018  5:57 PM EST ----- Regarding: "Results" Patient is positive for Trich, BV and E. Coli. She is currently being treated effectively for E. Coli with Septra.   Swab revealed a disruption in normal flora of vagina, Trich and BV. Please inform patient that we have sent Rx for Flagyl to pharmacy today. She is to take medication as directed as a one time dose.   She is to complete all medication. Most importantly, avoid Alcohol while taking Flagyl.   The most common causes of Trich and Bacterial Vaginosis are: multiple sex partners, new sexual partner, and douching.   Please inform patient that if symptoms are not resolved after completion of antibiotics, she is to contact office for possible additional treatment.      Thank you.

## 2018-05-01 NOTE — Telephone Encounter (Signed)
-----   Message from Natalie M Stroud, FNP sent at 04/30/2018  5:57 PM EST ----- Regarding: "Results" Patient is positive for Trich, BV and E. Coli. She is currently being treated effectively for E. Coli with Septra.   Swab revealed a disruption in normal flora of vagina, Trich and BV. Please inform patient that we have sent Rx for Flagyl to pharmacy today. She is to take medication as directed as a one time dose.   She is to complete all medication. Most importantly, avoid Alcohol while taking Flagyl.   The most common causes of Trich and Bacterial Vaginosis are: multiple sex partners, new sexual partner, and douching.   Please inform patient that if symptoms are not resolved after completion of antibiotics, she is to contact office for possible additional treatment.      Thank you.      

## 2018-05-01 NOTE — Telephone Encounter (Signed)
Left a vm for patient to callack

## 2018-05-01 NOTE — Telephone Encounter (Signed)
Patient notified and will pickup treatment

## 2018-05-02 ENCOUNTER — Other Ambulatory Visit: Payer: Self-pay | Admitting: Family Medicine

## 2018-05-02 DIAGNOSIS — A599 Trichomoniasis, unspecified: Secondary | ICD-10-CM

## 2018-05-02 MED ORDER — METRONIDAZOLE 500 MG PO TABS: 2000.0000 mg | ORAL_TABLET | Freq: Once | ORAL | 0 refills | Status: AC

## 2018-05-02 NOTE — Telephone Encounter (Signed)
Patient states that her antibiotic were not at the pharmacy.

## 2018-05-09 ENCOUNTER — Ambulatory Visit: Payer: Self-pay | Admitting: Family Medicine

## 2018-05-16 ENCOUNTER — Encounter: Payer: Self-pay | Admitting: Family Medicine

## 2018-05-16 ENCOUNTER — Ambulatory Visit (INDEPENDENT_AMBULATORY_CARE_PROVIDER_SITE_OTHER): Payer: Self-pay | Admitting: Family Medicine

## 2018-05-16 VITALS — BP 102/68 | HR 78 | Temp 98.4°F | Ht 67.0 in | Wt 210.0 lb

## 2018-05-16 DIAGNOSIS — Z09 Encounter for follow-up examination after completed treatment for conditions other than malignant neoplasm: Secondary | ICD-10-CM

## 2018-05-16 DIAGNOSIS — F32A Depression, unspecified: Secondary | ICD-10-CM

## 2018-05-16 DIAGNOSIS — R300 Dysuria: Secondary | ICD-10-CM

## 2018-05-16 DIAGNOSIS — F419 Anxiety disorder, unspecified: Secondary | ICD-10-CM

## 2018-05-16 DIAGNOSIS — Z0000 Encounter for general adult medical examination without abnormal findings: Secondary | ICD-10-CM

## 2018-05-16 DIAGNOSIS — F329 Major depressive disorder, single episode, unspecified: Secondary | ICD-10-CM

## 2018-05-16 NOTE — Progress Notes (Signed)
Follow Up  Subjective:    Patient ID: Tammy Green, female    DOB: [birth year], 43 y.o.   MRN: [id number]   Chief Complaint  Patient presents with  . Follow-up    STI/Anxiety   . Referral    Behavioral health     HPI  Tammy Green is a 43 year old female who presents today for follow up chronic issues.   Past Medical History:  Diagnosis Date  . Anxiety   . Depression   . Kidney stones 2013  . Thyroid disease    Family History  Problem Relation Age of Onset  . Bipolar disorder Mother   . Bipolar disorder Sister   . Bipolar disorder Brother   . Diabetes Maternal Grandmother   . Cancer Maternal Grandfather    Social History   Tobacco Use  . Smoking status: Current Some Day Smoker    Packs/day: 0.25    Years: 18.00    Pack years: 4.50  . Smokeless tobacco: Former Neurosurgeon    Quit date: 11/15/2011  Substance Use Topics  . Alcohol use: No  . Drug use: No   Current Meds  Medication Sig  . clonazePAM (KLONOPIN) 0.25 MG disintegrating tablet Take 0.25 mg 2 (two) times daily as needed by mouth for seizure.  Marland Kitchen levothyroxine (SYNTHROID, LEVOTHROID) 125 MCG tablet Take 1 tablet (125 mcg total) by mouth daily.  Marland Kitchen PARoxetine (PAXIL) 30 MG tablet Take 2 tablets (60 mg total) by mouth every morning. Take two 30 tablets daily.    Allergies  Allergen Reactions  . Axid [Nizatidine] Hives  . Codeine Hives    Pt states she can take Percocet but can not take Vicodin  . Penicillin G Hives  . Sulfamethoxazole Other (See Comments) and Hives    fever    Current Status: Since her last office visit, she is doing well with no complaints. She has discontinued seeing psychiatrist at [facility] and would like another referral today. She denies suicidal ideations, homicidal ideations, or auditory hallucinations. She states that vaginal discomfort is resolved. She denies urinary frequency, discharge, dysuria, urinary itching, burning, odor, hematuria, and suprapubic pain/discomfort. She continues  to take thyroid medication daily as prescribed. She denies fatigue, inability to tolerate cold, unexplained weight gain, dry skin, coarse hair, decrease thought process, and constipation.    She denies fevers, chills, fatigue, recent infections, weight loss, and night sweats. She has not had any headaches, visual changes, dizziness, and falls. She reports occasional cough. No chest pain, heart palpitations, and shortness of breath reported. No reports of GI problems such as nausea, vomiting, and diarrhea.  She has no reports of blood in stools, dysuria and hematuria. She denies pain today.   Review of Systems  Constitutional: Negative.   HENT: Negative.   Eyes: Negative.   Respiratory: Positive for cough (Occasional ).   Cardiovascular: Negative.   Gastrointestinal: Negative.   Endocrine: Negative.   Genitourinary: Negative.   Musculoskeletal: Negative.   Skin: Negative.   Neurological: Negative.   Hematological: Negative.   Psychiatric/Behavioral: The patient is nervous/anxious.    Objective:   Physical Exam Vitals signs and nursing note reviewed.  Constitutional:      Appearance: Normal appearance.  HENT:     Head: Normocephalic and atraumatic.     Right Ear: External ear normal.     Left Ear: External ear normal.     Nose: Nose normal.     Mouth/Throat:     Mouth: Mucous membranes are moist.  Pharynx: Oropharynx is clear.  Eyes:     Conjunctiva/sclera: Conjunctivae normal.  Neck:     Musculoskeletal: Normal range of motion and neck supple.  Cardiovascular:     Rate and Rhythm: Normal rate and regular rhythm.     Pulses: Normal pulses.     Heart sounds: Normal heart sounds.  Pulmonary:     Effort: Pulmonary effort is normal.     Breath sounds: Normal breath sounds.  Abdominal:     General: Bowel sounds are normal. There is distension.     Palpations: Abdomen is soft.  Musculoskeletal: Normal range of motion.  Skin:    General: Skin is warm and dry.     Capillary  Refill: Capillary refill takes less than 2 seconds.  Neurological:     General: No focal deficit present.     Mental Status: She is alert and oriented to person, place, and time.  Psychiatric:        Mood and Affect: Mood normal.        Behavior: Behavior normal.        Thought Content: Thought content normal.        Judgment: Judgment normal.    Assessment & Plan:   1. Anxiety and depression We will refer her to Psychiatry. She will continue anti-anxiety medications as prescribed.   2. Dysuria Resolved.   3. Healthcare maintenance - Ambulatory referral to Psychiatry - CBC with Differential - Comprehensive metabolic panel; Future - Lipid Panel - Thyroid Panel With TSH  4. Follow up She will follow up in 6 months.   No orders of the defined types were placed in this encounter.  Orders Placed This Encounter  Procedures  . CBC with Differential  . Comprehensive metabolic panel  . Lipid Panel  . Thyroid Panel With TSH  . Ambulatory referral to Psychiatry     Referral Orders     Ambulatory referral to Psychiatry   Raliegh IpNatalie Norrin Shreffler,  MSN, FNP-C [facility] [address]  [place], [place] [address] [phone]

## 2018-05-17 LAB — THYROID PANEL WITH TSH
Free Thyroxine Index: 0.6 — ABNORMAL LOW (ref 1.2–4.9)
T3 Uptake Ratio: 22 % — ABNORMAL LOW (ref 24–39)
T4, Total: 2.7 ug/dL — ABNORMAL LOW (ref 4.5–12.0)
TSH: 37.62 u[IU]/mL — ABNORMAL HIGH (ref 0.450–4.500)

## 2018-05-17 LAB — CBC WITH DIFFERENTIAL/PLATELET
Basophils Absolute: 0.1 10*3/uL (ref 0.0–0.2)
Basos: 1 %
EOS (ABSOLUTE): 0.1 10*3/uL (ref 0.0–0.4)
Eos: 1 %
Hematocrit: 41.7 % (ref 34.0–46.6)
Hemoglobin: 13.8 g/dL (ref 11.1–15.9)
Immature Grans (Abs): 0 10*3/uL (ref 0.0–0.1)
Immature Granulocytes: 0 %
Lymphocytes Absolute: 1.4 10*3/uL (ref 0.7–3.1)
Lymphs: 14 %
MCH: 29.4 pg (ref 26.6–33.0)
MCHC: 33.1 g/dL (ref 31.5–35.7)
MCV: 89 fL (ref 79–97)
Monocytes Absolute: 0.5 10*3/uL (ref 0.1–0.9)
Monocytes: 5 %
Neutrophils Absolute: 7.7 10*3/uL — ABNORMAL HIGH (ref 1.4–7.0)
Neutrophils: 79 %
Platelets: 404 10*3/uL (ref 150–450)
RBC: 4.7 x10E6/uL (ref 3.77–5.28)
RDW: 13.8 % (ref 11.7–15.4)
WBC: 9.8 10*3/uL (ref 3.4–10.8)

## 2018-05-17 LAB — LIPID PANEL
Chol/HDL Ratio: 3.9 ratio (ref 0.0–4.4)
Cholesterol, Total: 155 mg/dL (ref 100–199)
HDL: 40 mg/dL (ref >39)
LDL Calculated: 85 mg/dL (ref 0–99)
Triglycerides: 148 mg/dL (ref 0–149)
VLDL Cholesterol Cal: 30 mg/dL (ref 5–40)

## 2018-05-22 ENCOUNTER — Other Ambulatory Visit: Payer: Self-pay | Admitting: Family Medicine

## 2018-05-22 DIAGNOSIS — E039 Hypothyroidism, unspecified: Secondary | ICD-10-CM

## 2018-05-22 MED ORDER — LEVOTHYROXINE SODIUM 137 MCG PO TABS: 137.0000 ug | ORAL_TABLET | Freq: Every day | ORAL | 2 refills | Status: DC

## 2018-05-22 NOTE — Telephone Encounter (Signed)
-----   Message from Kallie Locks, FNP sent at 05/22/2018  8:58 AM EST ----- Regarding: "Medication Dose Adjustment" We will dose adjust thyroid medication. Rx for Levothyroxine 137 mcg sent to pharmacy today. Please inform patient. Please have her to schedule for follow up lab draw only in 6-8 weeks to re-evaluate Thyroid levels. Order placed. Thank you.

## 2018-05-22 NOTE — Telephone Encounter (Signed)
Patient notified and will callback and schedule lab appointment

## 2018-11-14 ENCOUNTER — Encounter: Payer: Self-pay | Admitting: Family Medicine

## 2018-11-14 ENCOUNTER — Other Ambulatory Visit: Payer: Self-pay | Admitting: Family Medicine

## 2018-11-14 ENCOUNTER — Other Ambulatory Visit: Payer: Self-pay

## 2018-11-14 ENCOUNTER — Ambulatory Visit (INDEPENDENT_AMBULATORY_CARE_PROVIDER_SITE_OTHER): Payer: Self-pay | Admitting: Family Medicine

## 2018-11-14 VITALS — BP 100/66 | HR 60 | Temp 98.2°F | Ht 67.0 in | Wt 222.0 lb

## 2018-11-14 DIAGNOSIS — R8290 Unspecified abnormal findings in urine: Secondary | ICD-10-CM

## 2018-11-14 DIAGNOSIS — N390 Urinary tract infection, site not specified: Secondary | ICD-10-CM

## 2018-11-14 DIAGNOSIS — Z09 Encounter for follow-up examination after completed treatment for conditions other than malignant neoplasm: Secondary | ICD-10-CM

## 2018-11-14 DIAGNOSIS — F329 Major depressive disorder, single episode, unspecified: Secondary | ICD-10-CM

## 2018-11-14 DIAGNOSIS — Z131 Encounter for screening for diabetes mellitus: Secondary | ICD-10-CM

## 2018-11-14 DIAGNOSIS — R319 Hematuria, unspecified: Secondary | ICD-10-CM

## 2018-11-14 DIAGNOSIS — E039 Hypothyroidism, unspecified: Secondary | ICD-10-CM

## 2018-11-14 DIAGNOSIS — F419 Anxiety disorder, unspecified: Secondary | ICD-10-CM

## 2018-11-14 LAB — POCT URINALYSIS DIP (MANUAL ENTRY)
Bilirubin, UA: NEGATIVE
Glucose, UA: NEGATIVE mg/dL
Ketones, POC UA: NEGATIVE mg/dL
Nitrite, UA: NEGATIVE
Protein Ur, POC: NEGATIVE mg/dL
Spec Grav, UA: >=1.03 — AB (ref 1.010–1.025)
Urobilinogen, UA: 0.2 E.U./dL
pH, UA: 6 (ref 5.0–8.0)

## 2018-11-14 LAB — POCT GLYCOSYLATED HEMOGLOBIN (HGB A1C): Hemoglobin A1C: 5.7 % — AB (ref 4.0–5.6)

## 2018-11-14 MED ORDER — LEVOTHYROXINE SODIUM 137 MCG PO TABS: 137.0000 ug | ORAL_TABLET | Freq: Every day | ORAL | 2 refills | Status: DC

## 2018-11-14 MED ORDER — NITROFURANTOIN MONOHYD MACRO 100 MG PO CAPS: 100.0000 mg | ORAL_CAPSULE | Freq: Two times a day (BID) | ORAL | 0 refills | Status: AC

## 2018-11-14 NOTE — Progress Notes (Signed)
[facility]   Established Patient Office Visit  Subjective:  Patient ID: Tammy Green, female    DOB: [birth year]  Age: 43 y.o. MRN: [id number]  CC:  Chief Complaint  Patient presents with  . Follow-up    Chronic condition     HPI Tammy Green is a 43 year old female who presents for follow up today.   Past Medical History:  Diagnosis Date  . Anxiety   . Depression   . Kidney stones 2013  . Thyroid disease    Current Status: Since her last office visit, she is doing well with no complaints. She states that she has not taken her Thyroid medication for the last 2 days. She denies fatigue, inability to tolerate cold, unexplained weight gain, dry skin, heart palpitations, coarse hair, decrease thought process, constipation, depression.  Her anxiety is mild today. She denies suicidal ideations, homicidal ideations, or auditory hallucinations. She has discontinued all anti-anxiety medication.  She denies fevers, chills, fatigue, recent infections, weight loss, and night sweats. She has not had any headaches, visual changes, dizziness, and falls. No chest pain, heart palpitations, cough and shortness of breath reported. No reports of GI problems such as nausea, vomiting, diarrhea, and constipation. She has no reports of blood in stools, dysuria and hematuria. She denies pain today.   Past Surgical History:  Procedure Laterality Date  . FEMUR FRACTURE SURGERY    . WISDOM TOOTH EXTRACTION      Family History  Problem Relation Age of Onset  . Bipolar disorder Mother   . Bipolar disorder Sister   . Bipolar disorder Brother   . Diabetes Maternal Grandmother   . Cancer Maternal Grandfather     Social History   Socioeconomic History  . Marital status: Single    Spouse name: Not on file  . Number of children: Not on file  . Years of education: Not on file  . Highest education level: Not on file  Occupational History  . Not on  file  Social Needs  . Financial resource strain: Not on file  . Food insecurity    Worry: Not on file    Inability: Not on file  . Transportation needs    Medical: Not on file    Non-medical: Not on file  Tobacco Use  . Smoking status: Former Smoker    Packs/day: 0.25    Years: 18.00    Pack years: 4.50    Quit date: 10/15/2018    Years since quitting: 0.0  . Smokeless tobacco: Former NeurosurgeonUser    Quit date: 11/15/2011  Substance and Sexual Activity  . Alcohol use: No  . Drug use: No  . Sexual activity: Not Currently    Birth control/protection: None    Comment: not since she conceived  Lifestyle  . Physical activity    Days per week: Not on file    Minutes per session: Not on file  . Stress: Not on file  Relationships  . Social Musicianconnections    Talks on phone: Not on file    Gets together: Not on file    Attends religious service: Not on file    Active member of club or organization: Not on file    Attends meetings of clubs or organizations: Not on file    Relationship status: Not on file  . Intimate partner violence    Fear of current or ex partner: Not on file    Emotionally abused:  Not on file    Physically abused: Not on file    Forced sexual activity: Not on file  Other Topics Concern  . Not on file  Social History Narrative  . Not on file    Outpatient Medications Prior to Visit  Medication Sig Dispense Refill  . levothyroxine (SYNTHROID, LEVOTHROID) 137 MCG tablet Take 1 tablet (137 mcg total) by mouth daily before breakfast. 30 tablet 2  . busPIRone (BUSPAR) 10 MG tablet Take 1 tablet (10 mg total) by mouth 3 (three) times daily. (Patient not taking: Reported on 04/28/2018) 90 tablet 1  . clonazePAM (KLONOPIN) 0.25 MG disintegrating tablet Take 0.25 mg 2 (two) times daily as needed by mouth for seizure.    Marland Kitchen. PARoxetine (PAXIL) 30 MG tablet Take 2 tablets (60 mg total) by mouth every morning. Take two 30 tablets daily. (Patient not taking: Reported on 11/14/2018) 60  tablet 3   No facility-administered medications prior to visit.     Allergies  Allergen Reactions  . Axid [Nizatidine] Hives  . Codeine Hives    Pt states she can take Percocet but can not take Vicodin  . Penicillin G Hives  . Sulfamethoxazole Other (See Comments) and Hives    fever    ROS Review of Systems  Constitutional: Negative.   HENT: Negative.   Eyes: Negative.   Respiratory: Negative.   Cardiovascular: Negative.   Gastrointestinal: Positive for abdominal distention (obese).  Endocrine: Negative.   Genitourinary: Negative.   Musculoskeletal: Negative.   Skin: Negative.   Allergic/Immunologic: Negative.   Neurological: Negative.   Hematological: Negative.   Psychiatric/Behavioral: Negative.    Objective:    Physical Exam  Constitutional: She is oriented to person, place, and time. She appears well-developed and well-nourished.  HENT:  Head: Normocephalic and atraumatic.  Eyes: Conjunctivae are normal.  Neck: Normal range of motion. Neck supple.  Cardiovascular: Normal rate, regular rhythm, normal heart sounds and intact distal pulses.  Pulmonary/Chest: Effort normal and breath sounds normal.  Abdominal: Soft. Bowel sounds are normal.  Musculoskeletal: Normal range of motion.  Neurological: She is alert and oriented to person, place, and time. She has normal reflexes.  Skin: Skin is warm and dry.  Psychiatric: She has a normal mood and affect. Her behavior is normal. Judgment and thought content normal.  Nursing note and vitals reviewed.   BP 100/66 (BP Location: Right Arm, Patient Position: Sitting, Cuff Size: Large)   Pulse 60   Temp 98.2 F (36.8 C) (Oral)   Ht 5\' 7"  (1.702 m)   Wt 222 lb (100.7 kg)   LMP 10/23/2018   SpO2 98%   BMI 34.77 kg/m  Wt Readings from Last 3 Encounters:  11/14/18 222 lb (100.7 kg)  05/16/18 210 lb (95.3 kg)  04/28/18 210 lb (95.3 kg)     Health Maintenance Due  Topic Date Due  . PAP SMEAR-Modifier  06/11/1996     There are no preventive care reminders to display for this patient.  Lab Results  Component Value Date   TSH 37.620 (H) 05/16/2018   Lab Results  Component Value Date   WBC 9.8 05/16/2018   HGB 13.8 05/16/2018   HCT 41.7 05/16/2018   MCV 89 05/16/2018   PLT 404 05/16/2018   Lab Results  Component Value Date   NA 141 12/30/2017   K 3.8 12/30/2017   CO2 25 12/30/2017   GLUCOSE 129 (H) 12/30/2017   BUN 10 12/30/2017   CREATININE 0.77 12/30/2017   BILITOT  0.7 12/30/2017   ALKPHOS 80 12/30/2017   AST 28 12/30/2017   ALT 31 12/30/2017   PROT 5.8 (L) 12/30/2017   ALBUMIN 3.0 (L) 12/30/2017   CALCIUM 8.5 (L) 12/30/2017   ANIONGAP 7 12/30/2017   Lab Results  Component Value Date   CHOL 155 05/16/2018   Lab Results  Component Value Date   HDL 40 05/16/2018   Lab Results  Component Value Date   LDLCALC 85 05/16/2018   Lab Results  Component Value Date   TRIG 148 05/16/2018   Lab Results  Component Value Date   CHOLHDL 3.9 05/16/2018   Lab Results  Component Value Date   HGBA1C 5.7 (A) 11/14/2018   Assessment & Plan:   1. Anxiety and depression Stable. She has discontinued anti-anxiety medications, but is doing well at this time. She is currently taking a 'natural' remedy to aide in symptoms. Monitor.   2. Hypothyroidism, unspecified type - levothyroxine (SYNTHROID) 137 MCG tablet; Take 1 tablet (137 mcg total) by mouth daily before breakfast.  Dispense: 30 tablet; Refill: 2 - CBC with Differential - Comprehensive metabolic panel - Lipid Panel - Thyroid Panel With TSH - Vitamin D, 25-hydroxy - Vitamin B12  3. Screening for diabetes mellitus Hgb A1c is stable at 5.7 today. She will continue to decrease foods/beverages high in sugars and carbs and follow Heart Healthy or DASH diet. Increase physical activity to at least 30 minutes cardio exercise daily.  - POCT urinalysis dipstick - POCT glycosylated hemoglobin (Hb A1C)  4. Abnormal urinalysis Results  are pending.  - Urine Culture  5. Urinary tract infection with hematuria, site unspecified We will initiate Nitrofurantoin today.  - nitrofurantoin, macrocrystal-monohydrate, (MACROBID) 100 MG capsule; Take 1 capsule (100 mg total) by mouth 2 (two) times daily for 7 days.  Dispense: 14 capsule; Refill: 0  6. Follow up She will follow up in 6 months.   Current Outpatient Medications on File Prior to Visit  Medication Sig Dispense Refill  . busPIRone (BUSPAR) 10 MG tablet Take 1 tablet (10 mg total) by mouth 3 (three) times daily. (Patient not taking: Reported on 04/28/2018) 90 tablet 1  . clonazePAM (KLONOPIN) 0.25 MG disintegrating tablet Take 0.25 mg 2 (two) times daily as needed by mouth for seizure.    Marland Kitchen. PARoxetine (PAXIL) 30 MG tablet Take 2 tablets (60 mg total) by mouth every morning. Take two 30 tablets daily. (Patient not taking: Reported on 11/14/2018) 60 tablet 3   No current facility-administered medications on file prior to visit.     Orders Placed This Encounter  Procedures  . Urine Culture  . CBC with Differential  . Comprehensive metabolic panel  . Lipid Panel  . Thyroid Panel With TSH  . Vitamin D, 25-hydroxy  . Vitamin B12  . POCT urinalysis dipstick  . POCT glycosylated hemoglobin (Hb A1C)    Referral Orders  No referral(s) requested today    Raliegh IpNatalie Aarron Wierzbicki,  MSN, FNP-BC [facility] [address]  [place], [place] [address] [phone]   Problem List Items Addressed This Visit      Endocrine   Hypothyroidism   Relevant Medications   levothyroxine (SYNTHROID) 137 MCG tablet   Other Relevant Orders   CBC with Differential   Comprehensive metabolic panel   Lipid Panel   Thyroid Panel With TSH   Vitamin D, 25-hydroxy   Vitamin B12    Other Visit Diagnoses  Anxiety and depression    -  Primary   Screening for diabetes mellitus       Relevant Orders   POCT  urinalysis dipstick (Completed)   POCT glycosylated hemoglobin (Hb A1C) (Completed)   Abnormal urinalysis       Relevant Orders   Urine Culture   Urinary tract infection with hematuria, site unspecified       Relevant Medications   nitrofurantoin, macrocrystal-monohydrate, (MACROBID) 100 MG capsule   Follow up          Meds ordered this encounter  Medications  . nitrofurantoin, macrocrystal-monohydrate, (MACROBID) 100 MG capsule    Sig: Take 1 capsule (100 mg total) by mouth 2 (two) times daily for 7 days.    Dispense:  14 capsule    Refill:  0  . levothyroxine (SYNTHROID) 137 MCG tablet    Sig: Take 1 tablet (137 mcg total) by mouth daily before breakfast.    Dispense:  30 tablet    Refill:  2    Follow-up: Return in about 6 months (around 05/17/2019).    Azzie Glatter, FNP

## 2018-11-14 NOTE — Patient Instructions (Signed)
Nitrofurantoin tablets or capsules What is this medicine? NITROFURANTOIN (nye troe fyoor AN toyn) is an antibiotic. It is used to treat urinary tract infections. This medicine may be used for other purposes; ask your health care provider or pharmacist if you have questions. COMMON BRAND NAME(S): Macrobid, Macrodantin, Urotoin What should I tell my health care provider before I take this medicine? They need to know if you have any of these conditions:  anemia  diabetes  glucose-6-phosphate dehydrogenase deficiency  kidney disease  liver disease  lung disease  other chronic illness  an unusual or allergic reaction to nitrofurantoin, other antibiotics, other medicines, foods, dyes or preservatives  pregnant or trying to get pregnant  breast-feeding How should I use this medicine? Take this medicine by mouth with a glass of water. Follow the directions on the prescription label. Take this medicine with food or milk. Take your doses at regular intervals. Do not take your medicine more often than directed. Do not stop taking except on your doctor's advice. Talk to your pediatrician regarding the use of this medicine in children. While this drug may be prescribed for selected conditions, precautions do apply. Overdosage: If you think you have taken too much of this medicine contact a poison control center or emergency room at once. NOTE: This medicine is only for you. Do not share this medicine with others. What if I miss a dose? If you miss a dose, take it as soon as you can. If it is almost time for your next dose, take only that dose. Do not take double or extra doses. What may interact with this medicine?  antacids containing magnesium trisilicate  probenecid  quinolone antibiotics like ciprofloxacin, lomefloxacin, norfloxacin and ofloxacin  sulfinpyrazone This list may not describe all possible interactions. Give your health care provider a list of all the medicines, herbs,  non-prescription drugs, or dietary supplements you use. Also tell them if you smoke, drink alcohol, or use illegal drugs. Some items may interact with your medicine. What should I watch for while using this medicine? Tell your doctor or health care professional if your symptoms do not improve or if you get new symptoms. Drink several glasses of water a day. If you are taking this medicine for a long time, visit your doctor for regular checks on your progress. If you are diabetic, you may get a false positive result for sugar in your urine with certain brands of urine tests. Check with your doctor. What side effects may I notice from receiving this medicine? Side effects that you should report to your doctor or health care professional as soon as possible:  allergic reactions like skin rash or hives, swelling of the face, lips, or tongue  chest pain  cough  difficulty breathing  dizziness, drowsiness  fever or infection  joint aches or pains  pale or blue-tinted skin  redness, blistering, peeling or loosening of the skin, including inside the mouth  tingling, burning, pain, or numbness in hands or feet  unusual bleeding or bruising  unusually weak or tired  yellowing of eyes or skin Side effects that usually do not require medical attention (report to your doctor or health care professional if they continue or are bothersome):  dark urine  diarrhea  headache  loss of appetite  nausea or vomiting  temporary hair loss This list may not describe all possible side effects. Call your doctor for medical advice about side effects. You may report side effects to FDA at 1-800-FDA-1088. Where should  I keep my medicine? Keep out of the reach of children. Store at room temperature between 15 and 30 degrees C (59 and 86 degrees F). Protect from light. Throw away any unused medicine after the expiration date. NOTE: This sheet is a summary. It may not cover all possible information.  If you have questions about this medicine, talk to your doctor, pharmacist, or health care provider.  2020 Elsevier/Gold Standard (2007-11-01 15:56:47) Urinary Tract Infection, Adult A urinary tract infection (UTI) is an infection of any part of the urinary tract. The urinary tract includes:  The kidneys.  The ureters.  The bladder.  The urethra. These organs make, store, and get rid of pee (urine) in the body. What are the causes? This is caused by germs (bacteria) in your genital area. These germs grow and cause swelling (inflammation) of your urinary tract. What increases the risk? You are more likely to develop this condition if:  You have a small, thin tube (catheter) to drain pee.  You cannot control when you pee or poop (incontinence).  You are female, and: ? You use these methods to prevent pregnancy: ? A medicine that kills sperm (spermicide). ? A device that blocks sperm (diaphragm). ? You have low levels of a female hormone (estrogen). ? You are pregnant.  You have genes that add to your risk.  You are sexually active.  You take antibiotic medicines.  You have trouble peeing because of: ? A prostate that is bigger than normal, if you are female. ? A blockage in the part of your body that drains pee from the bladder (urethra). ? A kidney stone. ? A nerve condition that affects your bladder (neurogenic bladder). ? Not getting enough to drink. ? Not peeing often enough.  You have other conditions, such as: ? Diabetes. ? A weak disease-fighting system (immune system). ? Sickle cell disease. ? Gout. ? Injury of the spine. What are the signs or symptoms? Symptoms of this condition include:  Needing to pee right away (urgently).  Peeing often.  Peeing small amounts often.  Pain or burning when peeing.  Blood in the pee.  Pee that smells bad or not like normal.  Trouble peeing.  Pee that is cloudy.  Fluid coming from the vagina, if you are female.   Pain in the belly or lower back. Other symptoms include:  Throwing up (vomiting).  No urge to eat.  Feeling mixed up (confused).  Being tired and grouchy (irritable).  A fever.  Watery poop (diarrhea). How is this treated? This condition may be treated with:  Antibiotic medicine.  Other medicines.  Drinking enough water. Follow these instructions at home:  Medicines  Take over-the-counter and prescription medicines only as told by your doctor.  If you were prescribed an antibiotic medicine, take it as told by your doctor. Do not stop taking it even if you start to feel better. General instructions  Make sure you: ? Pee until your bladder is empty. ? Do not hold pee for a long time. ? Empty your bladder after sex. ? Wipe from front to back after pooping if you are a female. Use each tissue one time when you wipe.  Drink enough fluid to keep your pee pale yellow.  Keep all follow-up visits as told by your doctor. This is important. Contact a doctor if:  You do not get better after 1-2 days.  Your symptoms go away and then come back. Get help right away if:  You have very  bad back pain.  You have very bad pain in your lower belly.  You have a fever.  You are sick to your stomach (nauseous).  You are throwing up. Summary  A urinary tract infection (UTI) is an infection of any part of the urinary tract.  This condition is caused by germs in your genital area.  There are many risk factors for a UTI. These include having a small, thin tube to drain pee and not being able to control when you pee or poop.  Treatment includes antibiotic medicines for germs.  Drink enough fluid to keep your pee pale yellow. This information is not intended to replace advice given to you by your health care provider. Make sure you discuss any questions you have with your health care provider. Document Released: 09/29/2007 Document Revised: 03/30/2018 Document Reviewed:  10/20/2017 Elsevier Patient Education  2020 [employer]

## 2018-11-30 ENCOUNTER — Other Ambulatory Visit: Payer: Self-pay | Admitting: Family Medicine

## 2018-11-30 ENCOUNTER — Telehealth: Payer: Self-pay

## 2018-11-30 DIAGNOSIS — E0590 Thyrotoxicosis, unspecified without thyrotoxic crisis or storm: Secondary | ICD-10-CM

## 2018-11-30 DIAGNOSIS — E039 Hypothyroidism, unspecified: Secondary | ICD-10-CM

## 2018-11-30 NOTE — Telephone Encounter (Signed)
-----   Message from Azzie Glatter, [place] sent at 11/29/2018  3:46 PM EDT ----- Regarding: "Lab Results" TSH level is elevated. Please contact patient to assess if she is taking Levothyroxine 137 mcg daily as prescribed. We may possibly have to dose adjust medication. All other labs are stable. Thank you

## 2018-11-30 NOTE — Telephone Encounter (Signed)
Patient states that she has been taking the Levothyroxine daily.

## 2018-12-01 ENCOUNTER — Telehealth: Payer: Self-pay

## 2018-12-01 NOTE — Telephone Encounter (Signed)
Patient notified and will callback around October to schedule lab visit

## 2018-12-01 NOTE — Telephone Encounter (Signed)
Left a vm for patient to callback 

## 2018-12-15 ENCOUNTER — Encounter (HOSPITAL_COMMUNITY): Payer: Self-pay

## 2019-05-18 ENCOUNTER — Ambulatory Visit: Payer: Self-pay | Admitting: Family Medicine

## 2019-06-25 DIAGNOSIS — N926 Irregular menstruation, unspecified: Secondary | ICD-10-CM

## 2019-06-25 DIAGNOSIS — R7303 Prediabetes: Secondary | ICD-10-CM

## 2019-06-25 HISTORY — DX: Irregular menstruation, unspecified: N92.6

## 2019-06-25 HISTORY — DX: Prediabetes: R73.03

## 2019-07-09 ENCOUNTER — Telehealth: Payer: Self-pay | Admitting: Family Medicine

## 2019-07-09 NOTE — Telephone Encounter (Signed)
Pt was called and reminded of there appointment 

## 2019-07-10 ENCOUNTER — Encounter: Payer: Self-pay | Admitting: Family Medicine

## 2019-07-10 ENCOUNTER — Ambulatory Visit (INDEPENDENT_AMBULATORY_CARE_PROVIDER_SITE_OTHER): Payer: Self-pay | Admitting: Family Medicine

## 2019-07-10 ENCOUNTER — Other Ambulatory Visit: Payer: Self-pay

## 2019-07-10 VITALS — BP 96/64 | HR 82 | Temp 98.1°F | Ht 67.0 in | Wt 220.6 lb

## 2019-07-10 DIAGNOSIS — F419 Anxiety disorder, unspecified: Secondary | ICD-10-CM

## 2019-07-10 DIAGNOSIS — R739 Hyperglycemia, unspecified: Secondary | ICD-10-CM

## 2019-07-10 DIAGNOSIS — E039 Hypothyroidism, unspecified: Secondary | ICD-10-CM

## 2019-07-10 DIAGNOSIS — Z09 Encounter for follow-up examination after completed treatment for conditions other than malignant neoplasm: Secondary | ICD-10-CM

## 2019-07-10 DIAGNOSIS — R7303 Prediabetes: Secondary | ICD-10-CM

## 2019-07-10 DIAGNOSIS — Z0000 Encounter for general adult medical examination without abnormal findings: Secondary | ICD-10-CM

## 2019-07-10 DIAGNOSIS — F32A Depression, unspecified: Secondary | ICD-10-CM

## 2019-07-10 DIAGNOSIS — N926 Irregular menstruation, unspecified: Secondary | ICD-10-CM

## 2019-07-10 DIAGNOSIS — F329 Major depressive disorder, single episode, unspecified: Secondary | ICD-10-CM

## 2019-07-10 DIAGNOSIS — R635 Abnormal weight gain: Secondary | ICD-10-CM

## 2019-07-10 LAB — POCT GLYCOSYLATED HEMOGLOBIN (HGB A1C): Hemoglobin A1C: 5.9 % — AB (ref 4.0–5.6)

## 2019-07-10 LAB — POCT URINALYSIS DIPSTICK
Bilirubin, UA: NEGATIVE
Glucose, UA: NEGATIVE
Ketones, UA: NEGATIVE
Leukocytes, UA: NEGATIVE
Nitrite, UA: NEGATIVE
Protein, UA: POSITIVE — AB
Spec Grav, UA: >=1.03 — AB (ref 1.010–1.025)
Urobilinogen, UA: 0.2 E.U./dL
pH, UA: 6.5 (ref 5.0–8.0)

## 2019-07-10 LAB — GLUCOSE, POCT (MANUAL RESULT ENTRY): POC Glucose: 118 mg/dl — AB (ref 70–99)

## 2019-07-10 MED ORDER — METFORMIN HCL 500 MG PO TABS: 500.0000 mg | ORAL_TABLET | Freq: Two times a day (BID) | ORAL | 3 refills | Status: DC

## 2019-07-10 NOTE — Progress Notes (Signed)
[facility]    Established Patient Office Visit  Subjective:  Patient ID: Tammy Green, female    DOB: [birth year]  Age: 44 y.o. MRN: [id number]  CC:  Chief Complaint  Patient presents with  . Follow-up  . Back Pain    HPI Tammy Green is a 44 year old female who presents for Follow Up.  Past Medical History:  Diagnosis Date  . Anxiety   . Depression   . Kidney stones 2013  . Thyroid disease    Current Status: Since her last office visit, she has c/o irregular periods since 01/2019. She formally followed up with [facility]. She has not been taking Thyroid medication as prescribed, for months now. She had began to take natural supplements in hopes of controlling thyroid issues. She is currently in the process of joining gym. She denies fevers, chills, fatigue, recent infections, weight loss, and night sweats. She has not had any headaches, visual changes, dizziness, and falls. No chest pain, heart palpitations, cough and shortness of breath reported. No reports of GI problems such as nausea, vomiting, diarrhea, and constipation. She has no reports of blood in stools, dysuria and hematuria. No depression or anxiety reported today. She denies suicidal ideations, homicidal ideations, or auditory hallucinations. She denies pain today.   Past Surgical History:  Procedure Laterality Date  . FEMUR FRACTURE SURGERY    . WISDOM TOOTH EXTRACTION      Family History  Problem Relation Age of Onset  . Bipolar disorder Mother   . Bipolar disorder Sister   . Bipolar disorder Brother   . Diabetes Maternal Grandmother   . Cancer Maternal Grandfather     Social History   Socioeconomic History  . Marital status: Single    Spouse name: Not on file  . Number of children: Not on file  . Years of education: Not on file  . Highest education level: Not on file  Occupational History  . Not on file  Tobacco Use  . Smoking status: Former  Smoker    Packs/day: 0.25    Years: 18.00    Pack years: 4.50    Quit date: 10/15/2018    Years since quitting: 0.7  . Smokeless tobacco: Former Neurosurgeon    Quit date: 11/15/2011  Substance and Sexual Activity  . Alcohol use: No  . Drug use: No  . Sexual activity: Not Currently    Birth control/protection: None    Comment: not since she conceived  Other Topics Concern  . Not on file  Social History Narrative  . Not on file   Social Determinants of Health   Financial Resource Strain:   . Difficulty of Paying Living Expenses:   Food Insecurity:   . Worried About Programme researcher, broadcasting/film/video in the Last Year:   . Barista in the Last Year:   Transportation Needs:   . Freight forwarder (Medical):   Marland Kitchen Lack of Transportation (Non-Medical):   Physical Activity:   . Days of Exercise per Week:   . Minutes of Exercise per Session:   Stress:   . Feeling of Stress :   Social Connections:   . Frequency of Communication with Friends and Family:   . Frequency of Social Gatherings with Friends and Family:   . Attends Religious Services:   . Active Member of Clubs or Organizations:   . Attends Banker Meetings:   Marland Kitchen Marital Status:   Intimate  Partner Violence:   . Fear of Current or Ex-Partner:   . Emotionally Abused:   Marland Kitchen Physically Abused:   . Sexually Abused:     Outpatient Medications Prior to Visit  Medication Sig Dispense Refill  . Black Cohosh 175 MG CAPS Take by mouth.    . COLLAGEN-CHOND-HYALURONIC ACID PO Take by mouth.    . Flaxseed, Linseed, (FLAX SEED OIL PO) Take by mouth.    . Misc Natural Products (APPLE CIDER VINEGAR DIET PO) Take by mouth.    . Multiple Vitamins-Minerals (MULTIVITAMIN WITH MINERALS) tablet Take 1 tablet by mouth daily.    Satira Sark Johns Wort 450 MG CAPS Take by mouth.    . busPIRone (BUSPAR) 10 MG tablet Take 1 tablet (10 mg total) by mouth 3 (three) times daily. (Patient not taking: Reported on 04/28/2018) 90 tablet 1  . levothyroxine  (SYNTHROID) 137 MCG tablet Take 1 tablet (137 mcg total) by mouth daily before breakfast. (Patient not taking: Reported on 07/10/2019) 30 tablet 2  . PARoxetine (PAXIL) 30 MG tablet Take 2 tablets (60 mg total) by mouth every morning. Take two 30 tablets daily. (Patient not taking: Reported on 11/14/2018) 60 tablet 3  . clonazePAM (KLONOPIN) 0.25 MG disintegrating tablet Take 0.25 mg 2 (two) times daily as needed by mouth for seizure.     No facility-administered medications prior to visit.    Allergies  Allergen Reactions  . Axid [Nizatidine] Hives  . Codeine Hives    Pt states she can take Percocet but can not take Vicodin  . Penicillin G Hives  . Sulfamethoxazole Other (See Comments) and Hives    fever    ROS Review of Systems  Constitutional: Negative.   HENT: Negative.   Eyes: Negative.   Respiratory: Negative.   Cardiovascular: Negative.   Gastrointestinal: Negative.   Endocrine: Negative.   Genitourinary:       Irregular menstrual bleeding.   Musculoskeletal: Negative.   Skin: Negative.   Allergic/Immunologic: Negative.   Neurological: Negative.   Hematological: Negative.   Psychiatric/Behavioral: Negative.       Objective:    Physical Exam  Constitutional: She is oriented to person, place, and time. She appears well-developed and well-nourished.  HENT:  Head: Normocephalic and atraumatic.  Eyes: Conjunctivae are normal.  Cardiovascular: Normal rate, regular rhythm, normal heart sounds and intact distal pulses.  Pulmonary/Chest: Effort normal and breath sounds normal.  Abdominal: Soft. Bowel sounds are normal.  Musculoskeletal:        General: Normal range of motion.     Cervical back: Normal range of motion and neck supple.  Neurological: She is alert and oriented to person, place, and time. She has normal reflexes.  Skin: Skin is warm.  Psychiatric: She has a normal mood and affect. Her behavior is normal. Judgment and thought content normal.  Nursing note  and vitals reviewed.   BP 96/64   Pulse 82   Temp 98.1 F (36.7 C) (Oral)   Ht 5\' 7"  (1.702 m)   Wt 220 lb 9.6 oz (100.1 kg)   LMP 07/09/2019   BMI 34.55 kg/m  Wt Readings from Last 3 Encounters:  07/10/19 220 lb 9.6 oz (100.1 kg)  11/14/18 222 lb (100.7 kg)  05/16/18 210 lb (95.3 kg)     Health Maintenance Due  Topic Date Due  . PAP SMEAR-Modifier  Never done    There are no preventive care reminders to display for this patient.  Lab Results  Component Value Date  TSH 121.000 (H) 07/10/2019   Lab Results  Component Value Date   WBC 9.7 07/10/2019   HGB 14.5 07/10/2019   HCT 43.1 07/10/2019   MCV 93 07/10/2019   PLT 351 07/10/2019   Lab Results  Component Value Date   NA 138 07/10/2019   K 4.2 07/10/2019   CO2 24 07/10/2019   GLUCOSE 95 07/10/2019   BUN 8 07/10/2019   CREATININE 0.94 07/10/2019   BILITOT 0.5 07/10/2019   ALKPHOS 65 07/10/2019   AST 23 07/10/2019   ALT 14 07/10/2019   PROT 7.1 07/10/2019   ALBUMIN 4.6 07/10/2019   CALCIUM 9.2 07/10/2019   ANIONGAP 7 12/30/2017   Lab Results  Component Value Date   CHOL 202 (H) 07/10/2019   Lab Results  Component Value Date   HDL 52 07/10/2019   Lab Results  Component Value Date   LDLCALC 129 (H) 07/10/2019   Lab Results  Component Value Date   TRIG 116 07/10/2019   Lab Results  Component Value Date   CHOLHDL 3.9 07/10/2019   Lab Results  Component Value Date   HGBA1C 5.9 (A) 07/10/2019      Assessment & Plan:   1. Irregular menstrual bleeding - Ambulatory referral to Obstetrics / Gynecology  2. Hyperglycemia We will initiated Metformin today.  - metFORMIN (GLUCOPHAGE) 500 MG tablet; Take 1 tablet (500 mg total) by mouth 2 (two) times daily with a meal.  Dispense: 60 tablet; Refill: 3  3. Prediabetes - metFORMIN (GLUCOPHAGE) 500 MG tablet; Take 1 tablet (500 mg total) by mouth 2 (two) times daily with a meal.  Dispense: 60 tablet; Refill: 3  4. Weight gain  5.  Hypothyroidism, unspecified type We will re-assess Thyroid levels today. She will restart taking Thyroid medication as prescribed.   6. Anxiety and depression  7. Healthcare maintenance - POCT glycosylated hemoglobin (Hb A1C) - POCT urinalysis dipstick - POCT glucose (manual entry) - Thyroid Panel With TSH - T4, Free - T3, Free - Lipid Panel - CBC with Differential - Comprehensive metabolic panel - Vitamin P37 - Vitamin D, 25-hydroxy  8. Follow up She will follow up in 3 months.   Meds ordered this encounter  Medications  . metFORMIN (GLUCOPHAGE) 500 MG tablet    Sig: Take 1 tablet (500 mg total) by mouth 2 (two) times daily with a meal.    Dispense:  60 tablet    Refill:  3    Orders Placed This Encounter  Procedures  . Thyroid Panel With TSH  . T4, Free  . T3, Free  . Lipid Panel  . CBC with Differential  . Comprehensive metabolic panel  . Vitamin B12  . Vitamin D, 25-hydroxy  . Ambulatory referral to Obstetrics / Gynecology  . POCT glycosylated hemoglobin (Hb A1C)  . POCT urinalysis dipstick  . POCT glucose (manual entry)     Referral Orders     Ambulatory referral to Obstetrics / Gynecology   Kathe Becton,  MSN, FNP-BC [address], [place] [address] [phone]- fax  Problem List Items Addressed This Visit      Endocrine   Hypothyroidism    Other Visit Diagnoses    Irregular menstrual bleeding    -  Primary   Relevant Orders   Ambulatory referral to Obstetrics / Gynecology   Hyperglycemia       Relevant Medications   metFORMIN (GLUCOPHAGE) 500 MG tablet  Prediabetes       Relevant Medications   metFORMIN (GLUCOPHAGE) 500 MG tablet   Weight gain       Anxiety and depression       Healthcare maintenance       Relevant Orders   POCT glycosylated hemoglobin (Hb A1C) (Completed)   POCT urinalysis dipstick (Completed)   POCT glucose  (manual entry) (Completed)   Thyroid Panel With TSH (Completed)   T4, Free (Completed)   T3, Free (Completed)   Lipid Panel (Completed)   CBC with Differential (Completed)   Comprehensive metabolic panel (Completed)   Vitamin B12 (Completed)   Vitamin D, 25-hydroxy (Completed)   Follow up          Meds ordered this encounter  Medications  . metFORMIN (GLUCOPHAGE) 500 MG tablet    Sig: Take 1 tablet (500 mg total) by mouth 2 (two) times daily with a meal.    Dispense:  60 tablet    Refill:  3    Follow-up: Return in about 3 months (around 10/10/2019).    Kallie Locks, FNP

## 2019-07-11 LAB — CBC WITH DIFFERENTIAL/PLATELET
Basophils Absolute: 0.1 10*3/uL (ref 0.0–0.2)
Basos: 1 %
EOS (ABSOLUTE): 0.1 10*3/uL (ref 0.0–0.4)
Eos: 1 %
Hematocrit: 43.1 % (ref 34.0–46.6)
Hemoglobin: 14.5 g/dL (ref 11.1–15.9)
Immature Grans (Abs): 0 10*3/uL (ref 0.0–0.1)
Immature Granulocytes: 0 %
Lymphocytes Absolute: 1.7 10*3/uL (ref 0.7–3.1)
Lymphs: 17 %
MCH: 31.2 pg (ref 26.6–33.0)
MCHC: 33.6 g/dL (ref 31.5–35.7)
MCV: 93 fL (ref 79–97)
Monocytes Absolute: 0.5 10*3/uL (ref 0.1–0.9)
Monocytes: 6 %
Neutrophils Absolute: 7.3 10*3/uL — ABNORMAL HIGH (ref 1.4–7.0)
Neutrophils: 75 %
Platelets: 351 10*3/uL (ref 150–450)
RBC: 4.65 x10E6/uL (ref 3.77–5.28)
RDW: 13.6 % (ref 11.7–15.4)
WBC: 9.7 10*3/uL (ref 3.4–10.8)

## 2019-07-11 LAB — VITAMIN D 25 HYDROXY (VIT D DEFICIENCY, FRACTURES): Vit D, 25-Hydroxy: 27.9 ng/mL — ABNORMAL LOW (ref 30.0–100.0)

## 2019-07-11 LAB — THYROID PANEL WITH TSH
Free Thyroxine Index: 0.1 — ABNORMAL LOW (ref 1.2–4.9)
T3 Uptake Ratio: 17 % — ABNORMAL LOW (ref 24–39)
T4, Total: 0.6 ug/dL — CL (ref 4.5–12.0)
TSH: 121 u[IU]/mL — ABNORMAL HIGH (ref 0.450–4.500)

## 2019-07-11 LAB — T4, FREE: Free T4: <0.1 ng/dL — ABNORMAL LOW (ref 0.82–1.77)

## 2019-07-11 LAB — COMPREHENSIVE METABOLIC PANEL
ALT: 14 IU/L (ref 0–32)
AST: 23 IU/L (ref 0–40)
Albumin/Globulin Ratio: 1.8 (ref 1.2–2.2)
Albumin: 4.6 g/dL (ref 3.8–4.8)
Alkaline Phosphatase: 65 IU/L (ref 39–117)
BUN/Creatinine Ratio: 9 (ref 9–23)
BUN: 8 mg/dL (ref 6–24)
Bilirubin Total: 0.5 mg/dL (ref 0.0–1.2)
CO2: 24 mmol/L (ref 20–29)
Calcium: 9.2 mg/dL (ref 8.7–10.2)
Chloride: 102 mmol/L (ref 96–106)
Creatinine, Ser: 0.94 mg/dL (ref 0.57–1.00)
GFR calc Af Amer: 85 mL/min/{1.73_m2} (ref >59)
GFR calc non Af Amer: 74 mL/min/{1.73_m2} (ref >59)
Globulin, Total: 2.5 g/dL (ref 1.5–4.5)
Glucose: 95 mg/dL (ref 65–99)
Potassium: 4.2 mmol/L (ref 3.5–5.2)
Sodium: 138 mmol/L (ref 134–144)
Total Protein: 7.1 g/dL (ref 6.0–8.5)

## 2019-07-11 LAB — LIPID PANEL
Chol/HDL Ratio: 3.9 ratio (ref 0.0–4.4)
Cholesterol, Total: 202 mg/dL — ABNORMAL HIGH (ref 100–199)
HDL: 52 mg/dL (ref >39)
LDL Chol Calc (NIH): 129 mg/dL — ABNORMAL HIGH (ref 0–99)
Triglycerides: 116 mg/dL (ref 0–149)
VLDL Cholesterol Cal: 21 mg/dL (ref 5–40)

## 2019-07-11 LAB — T3, FREE: T3, Free: <0.3 pg/mL — ABNORMAL LOW (ref 2.0–4.4)

## 2019-07-11 LAB — VITAMIN B12: Vitamin B-12: 753 pg/mL (ref 232–1245)

## 2019-07-12 ENCOUNTER — Encounter: Payer: Self-pay | Admitting: Family Medicine

## 2019-07-12 DIAGNOSIS — R739 Hyperglycemia, unspecified: Secondary | ICD-10-CM | POA: Insufficient documentation

## 2019-07-12 DIAGNOSIS — R7303 Prediabetes: Secondary | ICD-10-CM | POA: Insufficient documentation

## 2019-07-12 DIAGNOSIS — N926 Irregular menstruation, unspecified: Secondary | ICD-10-CM | POA: Insufficient documentation

## 2019-07-19 ENCOUNTER — Telehealth: Payer: Self-pay

## 2019-07-19 ENCOUNTER — Other Ambulatory Visit: Payer: Self-pay | Admitting: Family Medicine

## 2019-07-19 DIAGNOSIS — E039 Hypothyroidism, unspecified: Secondary | ICD-10-CM

## 2019-07-19 MED ORDER — LEVOTHYROXINE SODIUM 150 MCG PO TABS: 150.0000 ug | ORAL_TABLET | Freq: Every day | ORAL | 3 refills | Status: DC

## 2019-07-19 NOTE — Telephone Encounter (Signed)
Mrs. Tammy Green said you where sending her Thyroid medicine after reviewing her labs. But it is not at the pharmacy.   Please advise.

## 2019-07-20 ENCOUNTER — Telehealth: Payer: Self-pay

## 2019-07-20 NOTE — Telephone Encounter (Signed)
Patient aware of Rx.  

## 2019-10-10 ENCOUNTER — Encounter: Payer: Self-pay | Admitting: Family Medicine

## 2019-10-10 ENCOUNTER — Other Ambulatory Visit: Payer: Self-pay

## 2019-10-10 ENCOUNTER — Ambulatory Visit (INDEPENDENT_AMBULATORY_CARE_PROVIDER_SITE_OTHER): Payer: Self-pay | Admitting: Family Medicine

## 2019-10-10 ENCOUNTER — Ambulatory Visit: Payer: Self-pay | Admitting: Family Medicine

## 2019-10-10 VITALS — BP 104/68 | HR 82 | Temp 97.9°F | Ht 67.0 in | Wt 204.1 lb

## 2019-10-10 DIAGNOSIS — Z09 Encounter for follow-up examination after completed treatment for conditions other than malignant neoplasm: Secondary | ICD-10-CM

## 2019-10-10 DIAGNOSIS — Z0000 Encounter for general adult medical examination without abnormal findings: Secondary | ICD-10-CM

## 2019-10-10 DIAGNOSIS — N926 Irregular menstruation, unspecified: Secondary | ICD-10-CM

## 2019-10-10 DIAGNOSIS — R739 Hyperglycemia, unspecified: Secondary | ICD-10-CM

## 2019-10-10 DIAGNOSIS — R634 Abnormal weight loss: Secondary | ICD-10-CM | POA: Insufficient documentation

## 2019-10-10 DIAGNOSIS — E039 Hypothyroidism, unspecified: Secondary | ICD-10-CM

## 2019-10-10 DIAGNOSIS — R7309 Other abnormal glucose: Secondary | ICD-10-CM

## 2019-10-10 DIAGNOSIS — R7303 Prediabetes: Secondary | ICD-10-CM

## 2019-10-10 DIAGNOSIS — F329 Major depressive disorder, single episode, unspecified: Secondary | ICD-10-CM

## 2019-10-10 DIAGNOSIS — R82998 Other abnormal findings in urine: Secondary | ICD-10-CM

## 2019-10-10 DIAGNOSIS — F419 Anxiety disorder, unspecified: Secondary | ICD-10-CM

## 2019-10-10 LAB — POCT URINALYSIS DIPSTICK
Bilirubin, UA: NEGATIVE
Glucose, UA: NEGATIVE
Ketones, UA: NEGATIVE
Nitrite, UA: NEGATIVE
Protein, UA: NEGATIVE
Spec Grav, UA: 1.025 (ref 1.010–1.025)
Urobilinogen, UA: 1 E.U./dL
pH, UA: 7.5 (ref 5.0–8.0)

## 2019-10-10 LAB — POCT GLYCOSYLATED HEMOGLOBIN (HGB A1C)
HbA1c POC (<> result, manual entry): 5.4 % (ref 4.0–5.6)
HbA1c, POC (controlled diabetic range): 5.4 % (ref 0.0–7.0)
HbA1c, POC (prediabetic range): 5.4 % — AB (ref 5.7–6.4)
Hemoglobin A1C: 5.4 % (ref 4.0–5.6)

## 2019-10-10 LAB — GLUCOSE, POCT (MANUAL RESULT ENTRY): POC Glucose: 141 mg/dl — AB (ref 70–99)

## 2019-10-10 NOTE — Progress Notes (Signed)
[facility]   Established Patient Office Visit  Subjective:  Patient ID: Tammy Green, female    DOB: [birth year]  Age: 44 y.o. MRN: [id number]  CC:  Chief Complaint  Patient presents with  . Follow-up    3 month follow up;     HPI Tammy Green is a 44 year old female who  presents for Follow Up today.  Patient Active Problem List   Diagnosis Date Noted  . Prediabetes 07/12/2019  . Hyperglycemia 07/12/2019  . Irregular menstrual bleeding 07/12/2019  . Hypothyroidism 08/07/2014  . Depression with anxiety 08/07/2014  . History of depression 06/18/2014  . History of anxiety state 06/18/2014  . Lump of skin of upper extremity 06/18/2014  . Kidney stones 03/30/2012   Current Status: Since her last office visit, she is doing well with no complaints. She has had a significant amount of weight loss recently. She denies fevers, chills, fatigue, recent infections, weight loss, and night sweats. She has not had any headaches, visual changes, dizziness, and falls. No chest pain, heart palpitations, cough and shortness of breath reported. Denies GI problems such as nausea, vomiting, diarrhea, and constipation. She has no reports of blood in stools, dysuria and hematuria. No depression or anxiety reported today. She denies suicidal ideations, homicidal ideations, or auditory hallucinations. She is taking all medications as prescribed. She denies pain today.   Past Medical History:  Diagnosis Date  . Anxiety   . Depression   . Irregular menstrual bleeding 06/2019  . Kidney stones 2013  . Prediabetes 06/2019  . Thyroid disease     Past Surgical History:  Procedure Laterality Date  . FEMUR FRACTURE SURGERY    . WISDOM TOOTH EXTRACTION      Family History  Problem Relation Age of Onset  . Bipolar disorder Mother   . Bipolar disorder Sister   . Bipolar disorder Brother   . Diabetes Maternal Grandmother   . Cancer Maternal  Grandfather     Social History   Socioeconomic History  . Marital status: Single    Spouse name: Not on file  . Number of children: Not on file  . Years of education: Not on file  . Highest education level: Not on file  Occupational History  . Not on file  Tobacco Use  . Smoking status: Former Smoker    Packs/day: 0.25    Years: 18.00    Pack years: 4.50    Quit date: 10/15/2018    Years since quitting: 0.9  . Smokeless tobacco: Former Neurosurgeon    Quit date: 11/15/2011  Vaping Use  . Vaping Use: Every day  . Substances: Nicotine, Flavoring  Substance and Sexual Activity  . Alcohol use: Yes    Comment: occ  . Drug use: No  . Sexual activity: Not Currently    Birth control/protection: None    Comment: not since she conceived  Other Topics Concern  . Not on file  Social History Narrative  . Not on file   Social Determinants of Health   Financial Resource Strain:   . Difficulty of Paying Living Expenses:   Food Insecurity:   . Worried About Programme researcher, broadcasting/film/video in the Last Year:   . Barista in the Last Year:   Transportation Needs:   . Freight forwarder (Medical):   Marland Kitchen Lack of Transportation (Non-Medical):   Physical Activity:   . Days of Exercise per Week:   .  Minutes of Exercise per Session:   Stress:   . Feeling of Stress :   Social Connections:   . Frequency of Communication with Friends and Family:   . Frequency of Social Gatherings with Friends and Family:   . Attends Religious Services:   . Active Member of Clubs or Organizations:   . Attends Archivist Meetings:   Marland Kitchen Marital Status:   Intimate Partner Violence:   . Fear of Current or Ex-Partner:   . Emotionally Abused:   Marland Kitchen Physically Abused:   . Sexually Abused:     Outpatient Medications Prior to Visit  Medication Sig Dispense Refill  . Black Cohosh 175 MG CAPS Take by mouth.    . COLLAGEN-CHOND-HYALURONIC ACID PO Take by mouth.    . Flaxseed, Linseed, (FLAX SEED OIL PO) Take by  mouth.    . levothyroxine (SYNTHROID) 150 MCG tablet Take 1 tablet (150 mcg total) by mouth daily before breakfast. 30 tablet 3  . metFORMIN (GLUCOPHAGE) 500 MG tablet Take 1 tablet (500 mg total) by mouth 2 (two) times daily with a meal. 60 tablet 3  . Misc Natural Products (APPLE CIDER VINEGAR DIET PO) Take by mouth.    . Multiple Vitamins-Minerals (MULTIVITAMIN WITH MINERALS) tablet Take 1 tablet by mouth daily.    Francella Solian Johns Wort 450 MG CAPS Take by mouth.    . busPIRone (BUSPAR) 10 MG tablet Take 1 tablet (10 mg total) by mouth 3 (three) times daily. (Patient not taking: Reported on 04/28/2018) 90 tablet 1  . PARoxetine (PAXIL) 30 MG tablet Take 2 tablets (60 mg total) by mouth every morning. Take two 30 tablets daily. (Patient not taking: Reported on 11/14/2018) 60 tablet 3   No facility-administered medications prior to visit.    Allergies  Allergen Reactions  . Axid [Nizatidine] Hives  . Codeine Hives    Pt states she can take Percocet but can not take Vicodin  . Penicillin G Hives  . Sulfamethoxazole Other (See Comments) and Hives    fever    ROS Review of Systems  Constitutional: Negative.   HENT: Negative.   Eyes: Negative.   Respiratory: Negative.   Cardiovascular: Negative.   Gastrointestinal: Negative.   Endocrine: Negative.   Genitourinary: Negative.   Musculoskeletal: Negative.   Skin: Negative.   Allergic/Immunologic: Negative.   Neurological: Positive for dizziness (occasional ) and headaches (occasional ).  Hematological: Negative.   Psychiatric/Behavioral: Negative.       Objective:    Physical Exam Vitals and nursing note reviewed.  Constitutional:      Appearance: Normal appearance.  HENT:     Head: Normocephalic and atraumatic.     Nose: Nose normal.     Mouth/Throat:     Mouth: Mucous membranes are moist.  Cardiovascular:     Rate and Rhythm: Normal rate and regular rhythm.     Pulses: Normal pulses.     Heart sounds: Normal heart sounds.    Pulmonary:     Effort: Pulmonary effort is normal.     Breath sounds: Normal breath sounds.  Abdominal:     General: Bowel sounds are normal. There is distension.     Palpations: Abdomen is soft.  Musculoskeletal:        General: Normal range of motion.     Cervical back: Normal range of motion and neck supple.  Skin:    General: Skin is warm and dry.  Neurological:     General: No focal deficit present.  Mental Status: She is alert and oriented to person, place, and time.  Psychiatric:        Mood and Affect: Mood normal.        Behavior: Behavior normal.        Thought Content: Thought content normal.        Judgment: Judgment normal.     BP 104/68 (BP Location: Left Arm, Patient Position: Sitting, Cuff Size: Small)   Pulse 82   Temp 97.9 F (36.6 C)   Ht 5\' 7"  (1.702 m)   Wt 204 lb 0.8 oz (92.6 kg)   LMP 09/26/2019   SpO2 99%   BMI 31.96 kg/m  Wt Readings from Last 3 Encounters:  10/10/19 204 lb 0.8 oz (92.6 kg)  07/10/19 220 lb 9.6 oz (100.1 kg)  11/14/18 222 lb (100.7 kg)     Health Maintenance Due  Topic Date Due  . Hepatitis C Screening  Never done  . COVID-19 Vaccine (1) Never done  . PAP SMEAR-Modifier  Never done    There are no preventive care reminders to display for this patient.  Lab Results  Component Value Date   TSH 121.000 (H) 07/10/2019   Lab Results  Component Value Date   WBC 9.7 07/10/2019   HGB 14.5 07/10/2019   HCT 43.1 07/10/2019   MCV 93 07/10/2019   PLT 351 07/10/2019   Lab Results  Component Value Date   NA 138 07/10/2019   K 4.2 07/10/2019   CO2 24 07/10/2019   GLUCOSE 95 07/10/2019   BUN 8 07/10/2019   CREATININE 0.94 07/10/2019   BILITOT 0.5 07/10/2019   ALKPHOS 65 07/10/2019   AST 23 07/10/2019   ALT 14 07/10/2019   PROT 7.1 07/10/2019   ALBUMIN 4.6 07/10/2019   CALCIUM 9.2 07/10/2019   ANIONGAP 7 12/30/2017   Lab Results  Component Value Date   CHOL 202 (H) 07/10/2019   Lab Results  Component Value  Date   HDL 52 07/10/2019   Lab Results  Component Value Date   LDLCALC 129 (H) 07/10/2019   Lab Results  Component Value Date   TRIG 116 07/10/2019   Lab Results  Component Value Date   CHOLHDL 3.9 07/10/2019   Lab Results  Component Value Date   HGBA1C 5.4 10/10/2019   HGBA1C 5.4 10/10/2019   HGBA1C 5.4 (A) 10/10/2019   HGBA1C 5.4 10/10/2019         1. Healthcare maintenance - Urinalysis Dipstick - Glucose (CBG) - HgB A1c  2. Weight loss  16 ;]lb weight loss in 3 months.   3. Hypothyroidism, unspecified type - Thyroid Panel With TSH  4. Irregular menstrual bleeding  5. Hyperglycemia  6. Anxiety and depression Stable today.   7. Prediabetes  8. Hemoglobin A1c less than 7.0% Hgb A1c at 5.4 today. Monitor.   9. Urine white blood cells increased - Urine Culture  10. Follow up She will follow up in 6 months.   No orders of the defined types were placed in this encounter.   Orders Placed This Encounter  Procedures  . Urine Culture  . Thyroid Panel With TSH  . Urinalysis Dipstick  . Glucose (CBG)  . HgB A1c    Referral Orders  No referral(s) requested today    10/12/2019,  MSN, FNP-BC [facility] [address] [place], [place] [phone]- fax    Problem List Items Addressed This Visit  Endocrine   Hypothyroidism   Relevant Orders   Thyroid Panel With TSH     Other   Hyperglycemia   Irregular menstrual bleeding   Prediabetes    Other Visit Diagnoses    Healthcare maintenance    -  Primary   Relevant Orders   Urinalysis Dipstick (Completed)   Glucose (CBG) (Completed)   HgB A1c (Completed)   Weight loss       Anxiety and depression       Hemoglobin A1c less than 7.0%       Urine white blood cells increased       Relevant Orders   Urine Culture   Follow up          No orders of the defined types were placed in this  encounter.   Follow-up: Return in about 6 months (around 04/10/2020).    Kallie Locks, FNP

## 2019-10-11 ENCOUNTER — Other Ambulatory Visit: Payer: Self-pay | Admitting: Family Medicine

## 2019-10-11 DIAGNOSIS — E039 Hypothyroidism, unspecified: Secondary | ICD-10-CM

## 2019-10-11 LAB — THYROID PANEL WITH TSH
Free Thyroxine Index: 1.8 (ref 1.2–4.9)
T3 Uptake Ratio: 29 % (ref 24–39)
T4, Total: 6.1 ug/dL (ref 4.5–12.0)
TSH: 6.02 u[IU]/mL — ABNORMAL HIGH (ref 0.450–4.500)

## 2019-10-11 NOTE — Progress Notes (Signed)
Patient notified of results, verbally understood. Appointment for labs was made.

## 2019-10-12 LAB — URINE CULTURE

## 2019-11-01 ENCOUNTER — Ambulatory Visit: Payer: Medicaid Other | Admitting: Obstetrics & Gynecology

## 2019-11-09 ENCOUNTER — Other Ambulatory Visit: Payer: Self-pay

## 2019-11-15 ENCOUNTER — Ambulatory Visit: Payer: Medicaid Other | Admitting: Women's Health

## 2019-11-29 ENCOUNTER — Ambulatory Visit: Payer: Medicaid Other | Admitting: Obstetrics & Gynecology

## 2019-12-14 ENCOUNTER — Ambulatory Visit (INDEPENDENT_AMBULATORY_CARE_PROVIDER_SITE_OTHER): Payer: Self-pay | Admitting: Family Medicine

## 2019-12-14 ENCOUNTER — Encounter: Payer: Self-pay | Admitting: Family Medicine

## 2019-12-14 ENCOUNTER — Other Ambulatory Visit: Payer: Self-pay

## 2019-12-14 VITALS — BP 95/58 | HR 88 | Temp 97.7°F | Ht 67.0 in | Wt 208.2 lb

## 2019-12-14 DIAGNOSIS — F329 Major depressive disorder, single episode, unspecified: Secondary | ICD-10-CM

## 2019-12-14 DIAGNOSIS — Z09 Encounter for follow-up examination after completed treatment for conditions other than malignant neoplasm: Secondary | ICD-10-CM

## 2019-12-14 DIAGNOSIS — E039 Hypothyroidism, unspecified: Secondary | ICD-10-CM

## 2019-12-14 DIAGNOSIS — R7303 Prediabetes: Secondary | ICD-10-CM

## 2019-12-14 DIAGNOSIS — F419 Anxiety disorder, unspecified: Secondary | ICD-10-CM

## 2019-12-14 DIAGNOSIS — E0590 Thyrotoxicosis, unspecified without thyrotoxic crisis or storm: Secondary | ICD-10-CM

## 2019-12-14 MED ORDER — LEVOTHYROXINE SODIUM 150 MCG PO TABS: 150.0000 ug | ORAL_TABLET | Freq: Every day | ORAL | 11 refills | Status: DC

## 2019-12-14 NOTE — Progress Notes (Signed)
[facility]    Established Patient Office Visit  Subjective:  Patient ID: Tammy Green, female    DOB: [birth year]  Age: 44 y.o. MRN: [id number]  CC:  Chief Complaint  Patient presents with  . Follow-up    Recheck her thyroid.    HPI Tammy Green is a 44 year old female who presents for Follow Up today.    Patient Active Problem List   Diagnosis Date Noted  . Weight loss 10/10/2019  . Prediabetes 07/12/2019  . Hyperglycemia 07/12/2019  . Irregular menstrual bleeding 07/12/2019  . Hypothyroidism 08/07/2014  . Depression with anxiety 08/07/2014  . History of depression 06/18/2014  . History of anxiety state 06/18/2014  . Lump of skin of upper extremity 06/18/2014  . Kidney stones 03/30/2012   Past Medical History:  Diagnosis Date  . Anxiety   . Depression   . Irregular menstrual bleeding 06/2019  . Kidney stones 2013  . Prediabetes 06/2019  . Thyroid disease    Patient Active Problem List   Diagnosis Date Noted  . Weight loss 10/10/2019  . Prediabetes 07/12/2019  . Hyperglycemia 07/12/2019  . Irregular menstrual bleeding 07/12/2019  . Hypothyroidism 08/07/2014  . Depression with anxiety 08/07/2014  . History of depression 06/18/2014  . History of anxiety state 06/18/2014  . Lump of skin of upper extremity 06/18/2014  . Kidney stones 03/30/2012   Current Status: Since her last office visit, she is doing fairly well today. She has been without her Thyroid medication X 1 week now. She has been having urinary urgency lately. She denies fevers, chills, fatigue, recent infections, weight loss, and night sweats. She has not had any headaches, visual changes, dizziness, and falls. No chest pain, heart palpitations, cough and shortness of breath reported. Denies GI problems such as nausea, vomiting, diarrhea, and constipation. She has no reports of blood in stools, dysuria and hematuria. No depression or anxiety, and  denies suicidal ideations, homicidal ideations, or auditory hallucinations. She is taking all medications as prescribed. She denies pain today.   Past Surgical History:  Procedure Laterality Date  . FEMUR FRACTURE SURGERY    . WISDOM TOOTH EXTRACTION      Family History  Problem Relation Age of Onset  . Bipolar disorder Mother   . Bipolar disorder Sister   . Bipolar disorder Brother   . Diabetes Maternal Grandmother   . Cancer Maternal Grandfather     Social History   Socioeconomic History  . Marital status: Single    Spouse name: Not on file  . Number of children: Not on file  . Years of education: Not on file  . Highest education level: Not on file  Occupational History  . Not on file  Tobacco Use  . Smoking status: Former Smoker    Packs/day: 0.25    Years: 18.00    Pack years: 4.50    Quit date: 10/15/2018    Years since quitting: 1.1  . Smokeless tobacco: Former NeurosurgeonUser    Quit date: 11/15/2011  Vaping Use  . Vaping Use: Every day  . Substances: Nicotine, Flavoring  Substance and Sexual Activity  . Alcohol use: Yes    Comment: occ  . Drug use: No  . Sexual activity: Not Currently    Birth control/protection: None    Comment: not since she conceived  Other Topics Concern  . Not on file  Social History Narrative  . Not on file   Social  Determinants of Health   Financial Resource Strain:   . Difficulty of Paying Living Expenses: Not on file  Food Insecurity:   . Worried About Programme researcher, broadcasting/film/video in the Last Year: Not on file  . Ran Out of Food in the Last Year: Not on file  Transportation Needs:   . Lack of Transportation (Medical): Not on file  . Lack of Transportation (Non-Medical): Not on file  Physical Activity:   . Days of Exercise per Week: Not on file  . Minutes of Exercise per Session: Not on file  Stress:   . Feeling of Stress : Not on file  Social Connections:   . Frequency of Communication with Friends and Family: Not on file  . Frequency of  Social Gatherings with Friends and Family: Not on file  . Attends Religious Services: Not on file  . Active Member of Clubs or Organizations: Not on file  . Attends Banker Meetings: Not on file  . Marital Status: Not on file  Intimate Partner Violence:   . Fear of Current or Ex-Partner: Not on file  . Emotionally Abused: Not on file  . Physically Abused: Not on file  . Sexually Abused: Not on file    Outpatient Medications Prior to Visit  Medication Sig Dispense Refill  . COLLAGEN-CHOND-HYALURONIC ACID PO Take by mouth.    . Flaxseed, Linseed, (FLAX SEED OIL PO) Take by mouth.    . metFORMIN (GLUCOPHAGE) 500 MG tablet Take 1 tablet (500 mg total) by mouth 2 (two) times daily with a meal. 60 tablet 3  . Misc Natural Products (APPLE CIDER VINEGAR DIET PO) Take by mouth.    . Multiple Vitamins-Minerals (MULTIVITAMIN WITH MINERALS) tablet Take 1 tablet by mouth daily.    Satira Sark Johns Wort 450 MG CAPS Take by mouth.    . levothyroxine (SYNTHROID) 150 MCG tablet Take 1 tablet (150 mcg total) by mouth daily before breakfast. 30 tablet 3  . Black Cohosh 175 MG CAPS Take by mouth. (Patient not taking: Reported on 12/14/2019)    . busPIRone (BUSPAR) 10 MG tablet Take 1 tablet (10 mg total) by mouth 3 (three) times daily. (Patient not taking: Reported on 04/28/2018) 90 tablet 1  . PARoxetine (PAXIL) 30 MG tablet Take 2 tablets (60 mg total) by mouth every morning. Take two 30 tablets daily. (Patient not taking: Reported on 11/14/2018) 60 tablet 3   No facility-administered medications prior to visit.    Allergies  Allergen Reactions  . Axid [Nizatidine] Hives  . Codeine Hives    Pt states she can take Percocet but can not take Vicodin  . Penicillin G Hives  . Sulfamethoxazole Other (See Comments) and Hives    fever    ROS Review of Systems  Constitutional: Negative.   HENT: Negative.   Eyes: Negative.   Respiratory: Negative.   Cardiovascular: Negative.   Gastrointestinal:  Positive for abdominal distention.  Endocrine: Negative.   Genitourinary: Negative.   Musculoskeletal: Negative.   Skin: Negative.   Allergic/Immunologic: Negative.   Neurological: Positive for dizziness (occasional ) and headaches (occasional ).  Hematological: Negative.   Psychiatric/Behavioral: Negative.       Objective:    Physical Exam Vitals and nursing note reviewed.  Constitutional:      Appearance: Normal appearance. She is obese.  HENT:     Head: Normocephalic and atraumatic.     Nose: Nose normal.  Cardiovascular:     Rate and Rhythm: Normal rate and  regular rhythm.     Pulses: Normal pulses.     Heart sounds: Normal heart sounds.  Pulmonary:     Effort: Pulmonary effort is normal.     Breath sounds: Normal breath sounds.  Abdominal:     General: Bowel sounds are normal.     Palpations: Abdomen is soft.  Musculoskeletal:        General: Normal range of motion.     Cervical back: Normal range of motion and neck supple.  Skin:    General: Skin is warm and dry.  Neurological:     General: No focal deficit present.     Mental Status: She is alert and oriented to person, place, and time.  Psychiatric:        Mood and Affect: Mood normal.        Behavior: Behavior normal.        Thought Content: Thought content normal.        Judgment: Judgment normal.     BP (!) 95/58 (BP Location: Left Arm, Patient Position: Sitting, Cuff Size: Normal)   Pulse 88   Temp 97.7 F (36.5 C)   Ht 5\' 7"  (1.702 m)   Wt 208 lb 3.2 oz (94.4 kg)   BMI 32.61 kg/m  Wt Readings from Last 3 Encounters:  12/14/19 208 lb 3.2 oz (94.4 kg)  10/10/19 204 lb 0.8 oz (92.6 kg)  07/10/19 220 lb 9.6 oz (100.1 kg)     Health Maintenance Due  Topic Date Due  . Hepatitis C Screening  Never done  . COVID-19 Vaccine (1) Never done  . PAP SMEAR-Modifier  Never done    There are no preventive care reminders to display for this patient.  Lab Results  Component Value Date   TSH 0.246  (L) 12/14/2019   Lab Results  Component Value Date   WBC 9.7 07/10/2019   HGB 14.5 07/10/2019   HCT 43.1 07/10/2019   MCV 93 07/10/2019   PLT 351 07/10/2019   Lab Results  Component Value Date   NA 138 07/10/2019   K 4.2 07/10/2019   CO2 24 07/10/2019   GLUCOSE 95 07/10/2019   BUN 8 07/10/2019   CREATININE 0.94 07/10/2019   BILITOT 0.5 07/10/2019   ALKPHOS 65 07/10/2019   AST 23 07/10/2019   ALT 14 07/10/2019   PROT 7.1 07/10/2019   ALBUMIN 4.6 07/10/2019   CALCIUM 9.2 07/10/2019   ANIONGAP 7 12/30/2017   Lab Results  Component Value Date   CHOL 202 (H) 07/10/2019   Lab Results  Component Value Date   HDL 52 07/10/2019   Lab Results  Component Value Date   LDLCALC 129 (H) 07/10/2019   Lab Results  Component Value Date   TRIG 116 07/10/2019   Lab Results  Component Value Date   CHOLHDL 3.9 07/10/2019   Lab Results  Component Value Date   HGBA1C 5.4 10/10/2019   HGBA1C 5.4 10/10/2019   HGBA1C 5.4 (A) 10/10/2019   HGBA1C 5.4 10/10/2019      Assessment & Plan:   1. Hypothyroidism, unspecified type - TSH - T3, Free - T4, Free - levothyroxine (SYNTHROID) 150 MCG tablet; Take 1 tablet (150 mcg total) by mouth daily before breakfast.  Dispense: 30 tablet; Refill: 11  2. Anxiety and depression  3. Prediabetes  4. Follow up She will follow up in 6 months.   Meds ordered this encounter  Medications  . levothyroxine (SYNTHROID) 150 MCG tablet    Sig: Take  1 tablet (150 mcg total) by mouth daily before breakfast.    Dispense:  30 tablet    Refill:  11    Orders Placed This Encounter  Procedures  . TSH  . T3, Free  . T4, Free    Referral Orders  No referral(s) requested today    Raliegh Ip,  MSN, FNP-BC [facility] [address] [place], [place] [address] [phone] fax   Problem List Items Addressed This Visit      Endocrine     Hypothyroidism - Primary   Relevant Medications   levothyroxine (SYNTHROID) 150 MCG tablet   Other Relevant Orders   TSH (Completed)   T3, Free (Completed)   T4, Free (Completed)     Other   Prediabetes    Other Visit Diagnoses    Anxiety and depression       Follow up          Meds ordered this encounter  Medications  . levothyroxine (SYNTHROID) 150 MCG tablet    Sig: Take 1 tablet (150 mcg total) by mouth daily before breakfast.    Dispense:  30 tablet    Refill:  11    Follow-up: No follow-ups on file.    Kallie Locks, FNP

## 2019-12-15 LAB — T3, FREE: T3, Free: 2.8 pg/mL (ref 2.0–4.4)

## 2019-12-15 LAB — T4, FREE: Free T4: 1.09 ng/dL (ref 0.82–1.77)

## 2019-12-15 LAB — TSH: TSH: 0.246 u[IU]/mL — ABNORMAL LOW (ref 0.450–4.500)

## 2019-12-16 ENCOUNTER — Encounter: Payer: Self-pay | Admitting: Family Medicine

## 2019-12-17 ENCOUNTER — Other Ambulatory Visit: Payer: Self-pay | Admitting: Family Medicine

## 2019-12-17 DIAGNOSIS — E0590 Thyrotoxicosis, unspecified without thyrotoxic crisis or storm: Secondary | ICD-10-CM

## 2019-12-27 ENCOUNTER — Encounter: Payer: Self-pay | Admitting: Obstetrics & Gynecology

## 2019-12-27 ENCOUNTER — Ambulatory Visit (INDEPENDENT_AMBULATORY_CARE_PROVIDER_SITE_OTHER): Payer: Medicaid Other | Admitting: Obstetrics & Gynecology

## 2019-12-27 ENCOUNTER — Other Ambulatory Visit: Payer: Self-pay

## 2019-12-27 ENCOUNTER — Other Ambulatory Visit (HOSPITAL_COMMUNITY)
Admission: RE | Admit: 2019-12-27 | Discharge: 2019-12-27 | Disposition: A | Discharge status: Home / Self Care | Payer: Medicaid Other | Source: Ambulatory Visit | Attending: Obstetrics & Gynecology | Admitting: Obstetrics & Gynecology

## 2019-12-27 VITALS — BP 123/88 | HR 110 | Ht 67.0 in | Wt 201.0 lb

## 2019-12-27 DIAGNOSIS — Z01419 Encounter for gynecological examination (general) (routine) without abnormal findings: Secondary | ICD-10-CM | POA: Diagnosis present

## 2019-12-27 DIAGNOSIS — Z308 Encounter for other contraceptive management: Secondary | ICD-10-CM

## 2019-12-27 DIAGNOSIS — N3941 Urge incontinence: Secondary | ICD-10-CM

## 2019-12-27 NOTE — Progress Notes (Signed)
NGYN pt presents for annual and pap c/o urinary incontience. Pt requests all STD testing  Pap - overdue  Mammogram - Never

## 2019-12-27 NOTE — Patient Instructions (Signed)
Mammogram °A mammogram is an X-ray of the breasts that is done to check for changes that are not normal. This test can screen for and find any changes that may suggest breast cancer. Mammograms are regularly done on women. A man may have a mammogram if he has a lump or swelling in his breast. This test can also help to find other changes and variations in the breast. °Tell a doctor: °· About any allergies you have. °· If you have breast implants. °· If you have had previous breast disease, biopsy, or surgery. °· If you are breastfeeding. °· If you are younger than age 25. °· If you have a family history of breast cancer. °· Whether you are pregnant or may be pregnant. °What are the risks? °Generally, this is a safe procedure. However, problems may occur, including: °· Exposure to radiation. Radiation levels are very low with this test. °· The results being misinterpreted. °· The need for further tests. °· The inability of the mammogram to detect certain cancers. °What happens before the procedure? °· Have this test done about 1-2 weeks after your period. This is usually when your breasts are the least tender. °· If you are visiting a new doctor or clinic, send any past mammogram images to your new doctor's office. °· Wash your breasts and under your arms the day of the test. °· Do not use deodorants, perfumes, lotions, or powders on the day of the test. °· Take off any jewelry from your neck. °· Wear clothes that you can change into and out of easily. °What happens during the procedure? ° °· You will undress from the waist up. You will put on a gown. °· You will stand in front of the X-ray machine. °· Each breast will be placed between two plastic or glass plates. The plates will press down on your breast for a few seconds. Try to stay as relaxed as possible. This does not cause any harm to your breasts. Any discomfort you feel will be very brief. °· X-rays will be taken from different angles of each breast. °The  procedure may vary among doctors and hospitals. °What happens after the procedure? °· The mammogram will be read by a specialist (radiologist). °· You may need to do certain parts of the test again. This depends on the quality of the images. °· Ask when your test results will be ready. Make sure you get your test results. °· You may go back to your normal activities. °Summary °· A mammogram is a low energy X-ray of the breasts that is done to check for abnormal changes. A man may have this test if he has a lump or swelling in his breast. °· Before the procedure, tell your doctor about any breast problems that you have had in the past. °· Have this test done about 1-2 weeks after your period. °· For the test, each breast will be placed between two plastic or glass plates. The plates will press down on your breast for a few seconds. °· The mammogram will be read by a specialist (radiologist). Ask when your test results will be ready. Make sure you get your test results. °This information is not intended to replace advice given to you by your health care provider. Make sure you discuss any questions you have with your health care provider. °Document Revised: 12/01/2017 Document Reviewed: 12/01/2017 °Elsevier Patient Education © 2020 Elsevier Inc. ° °

## 2019-12-27 NOTE — Progress Notes (Signed)
Patient ID: Tammy Green, female   DOB: [birth year], 44 y.o.   MRN: [id number]  Chief Complaint  Patient presents with  . Gynecologic Exam  well woman She presents for annual exam that is overdue. Menses have been irregular but recently every month, heavy flow first 2 days.  HPI Tammy Green is a 44 y.o. female.  [id number] Patient's last menstrual period was 12/13/2019.  HPI  Past Medical History:  Diagnosis Date  . Anxiety   . Depression   . Irregular menstrual bleeding 06/2019  . Kidney stones 2013  . Prediabetes 06/2019  . Thyroid disease     Past Surgical History:  Procedure Laterality Date  . FEMUR FRACTURE SURGERY    . WISDOM TOOTH EXTRACTION      Family History  Problem Relation Age of Onset  . Bipolar disorder Mother   . Bipolar disorder Sister   . Bipolar disorder Brother   . Diabetes Maternal Grandmother   . Cancer Maternal Grandfather     Social History Social History   Tobacco Use  . Smoking status: Former Smoker    Packs/day: 0.25    Years: 18.00    Pack years: 4.50    Quit date: 10/15/2018    Years since quitting: 1.2  . Smokeless tobacco: Former Neurosurgeon    Quit date: 11/15/2011  Vaping Use  . Vaping Use: Every day  . Substances: Nicotine, Flavoring  Substance Use Topics  . Alcohol use: Yes    Comment: occ  . Drug use: No    Allergies  Allergen Reactions  . Axid [Nizatidine] Hives  . Codeine Hives    Pt states she can take Percocet but can not take Vicodin  . Penicillin G Hives  . Sulfamethoxazole Other (See Comments) and Hives    fever    Current Outpatient Medications  Medication Sig Dispense Refill  . Black Cohosh 175 MG CAPS Take by mouth.     . COLLAGEN-CHOND-HYALURONIC ACID PO Take by mouth.    . Flaxseed, Linseed, (FLAX SEED OIL PO) Take by mouth.    . levothyroxine (SYNTHROID) 150 MCG tablet Take 1 tablet (150 mcg total) by mouth daily before breakfast. 30 tablet 11  . metFORMIN (GLUCOPHAGE) 500 MG tablet Take 1 tablet (500  mg total) by mouth 2 (two) times daily with a meal. 60 tablet 3  . Misc Natural Products (APPLE CIDER VINEGAR DIET PO) Take by mouth.    . Multiple Vitamins-Minerals (MULTIVITAMIN WITH MINERALS) tablet Take 1 tablet by mouth daily.    Marland Kitchen OVER THE COUNTER MEDICATION Estrogen tablet and liquid    . St Johns Wort 450 MG CAPS Take by mouth.    . busPIRone (BUSPAR) 10 MG tablet Take 1 tablet (10 mg total) by mouth 3 (three) times daily. (Patient not taking: Reported on 04/28/2018) 90 tablet 1  . PARoxetine (PAXIL) 30 MG tablet Take 2 tablets (60 mg total) by mouth every morning. Take two 30 tablets daily. (Patient not taking: Reported on 11/14/2018) 60 tablet 3   No current facility-administered medications for this visit.    Review of Systems Review of Systems  Constitutional: Negative.   Respiratory: Negative.   Gastrointestinal: Negative.   Endocrine:       Occasional night sweats  Genitourinary: Positive for vaginal discharge. Negative for menstrual problem and pelvic pain.    Blood pressure 123/88, pulse (!) 110, height 5\' 7"  (1.702 m), weight 201 lb (91.2 kg), last menstrual period 12/13/2019.  Physical Exam Physical Exam  Nursing note reviewed. Exam conducted with a chaperone present.  Constitutional:      Appearance: She is obese. She is not ill-appearing.  Cardiovascular:     Rate and Rhythm: Normal rate.  Pulmonary:     Effort: Pulmonary effort is normal.  Abdominal:     General: Abdomen is flat.     Palpations: Abdomen is soft.  Genitourinary:    Comments: Pelvic exam: VULVA: normal appearing vulva with no masses, tenderness or lesions, VAGINA: vaginal discharge - thin, CERVIX: normal appearing cervix without discharge or lesions, UTERUS: uterus is normal size, shape, consistency and nontender, ADNEXA: normal adnexa in size, nontender and no masses.  Musculoskeletal:        General: Normal range of motion.     Cervical back: Normal range of motion.  Skin:    General: Skin  is warm and dry.  Neurological:     Mental Status: She is alert.  Psychiatric:        Mood and Affect: Mood normal.        Behavior: Behavior normal.   Breasts: breasts appear normal, no suspicious masses, no skin or nipple changes or axillary nodes. Bilateral nipple piercing  Data Reviewed delivery notes, history  Assessment Urgency incontinence - Plan: POCT Urinalysis Dipstick, Urine Culture  Well woman exam with routine gynecological exam - Plan: Urine Culture, RPR, HIV Antibody (routine testing w rflx), Hepatitis C antibody, Hepatitis B surface antigen, Cervicovaginal ancillary only( [facility]), Cytology - PAP( [facility]), MM DIGITAL SCREENING BILATERAL    Plan F/u on testing today treat vaginitis or UTI as indicated Screening mammography    Scheryl Darter 12/27/2019, 10:33 AM

## 2019-12-28 LAB — CERVICOVAGINAL ANCILLARY ONLY
Bacterial Vaginitis (gardnerella): POSITIVE — AB
Candida Glabrata: NEGATIVE
Candida Vaginitis: NEGATIVE
Chlamydia: NEGATIVE
Comment: NEGATIVE
Comment: NEGATIVE
Comment: NEGATIVE
Comment: NEGATIVE
Comment: NEGATIVE
Comment: NORMAL
Neisseria Gonorrhea: NEGATIVE
Trichomonas: POSITIVE — AB

## 2019-12-28 LAB — HIV ANTIBODY (ROUTINE TESTING W REFLEX): HIV Screen 4th Generation wRfx: NONREACTIVE

## 2019-12-28 LAB — HEPATITIS C ANTIBODY: Hep C Virus Ab: <0.1 s/co ratio (ref 0.0–0.9)

## 2019-12-28 LAB — HEPATITIS B SURFACE ANTIGEN: Hepatitis B Surface Ag: NEGATIVE

## 2019-12-28 LAB — RPR: RPR Ser Ql: NONREACTIVE

## 2019-12-29 LAB — URINE CULTURE

## 2020-01-01 LAB — CYTOLOGY - PAP
Comment: NEGATIVE
Diagnosis: NEGATIVE
Diagnosis: REACTIVE
High risk HPV: NEGATIVE

## 2020-01-31 ENCOUNTER — Other Ambulatory Visit: Payer: Self-pay | Admitting: Obstetrics and Gynecology

## 2020-01-31 ENCOUNTER — Other Ambulatory Visit: Payer: Self-pay

## 2020-01-31 ENCOUNTER — Telehealth: Payer: Self-pay

## 2020-01-31 MED ORDER — CEPHALEXIN 500 MG PO CAPS: 500.0000 mg | ORAL_CAPSULE | Freq: Four times a day (QID) | ORAL | 2 refills | Status: DC

## 2020-01-31 MED ORDER — METRONIDAZOLE 500 MG PO TABS: 500.0000 mg | ORAL_TABLET | Freq: Two times a day (BID) | ORAL | 0 refills | Status: DC

## 2020-01-31 NOTE — Telephone Encounter (Signed)
This patient urine culture was positive for a UTI so we have called in Kelflex to help.

## 2020-01-31 NOTE — Telephone Encounter (Signed)
Received call from patient requesting test results. Patient reports never receiving a phone call and is concerns about STI. After reviewing her chart, I noticed no one had made contact with her regarding her positive trich and BV. I will go ahead and call in medications to help treat patient symptoms. It looks like patient has a UTI so I will send a message to the provider since she has sensitivity to penicillin.

## 2020-03-01 ENCOUNTER — Other Ambulatory Visit: Payer: Self-pay | Admitting: Family Medicine

## 2020-03-01 DIAGNOSIS — R739 Hyperglycemia, unspecified: Secondary | ICD-10-CM

## 2020-03-01 DIAGNOSIS — R7303 Prediabetes: Secondary | ICD-10-CM

## 2020-03-04 ENCOUNTER — Other Ambulatory Visit: Payer: Self-pay | Admitting: Family Medicine

## 2020-03-04 ENCOUNTER — Telehealth: Payer: Self-pay | Admitting: Family Medicine

## 2020-03-04 DIAGNOSIS — R7303 Prediabetes: Secondary | ICD-10-CM

## 2020-03-04 DIAGNOSIS — R739 Hyperglycemia, unspecified: Secondary | ICD-10-CM

## 2020-03-04 MED ORDER — METFORMIN HCL 500 MG PO TABS: 500.0000 mg | ORAL_TABLET | Freq: Two times a day (BID) | ORAL | 3 refills | Status: DC

## 2020-03-04 NOTE — Telephone Encounter (Signed)
Done

## 2020-04-09 ENCOUNTER — Ambulatory Visit: Payer: Self-pay | Admitting: Family Medicine

## 2020-05-13 ENCOUNTER — Encounter: Payer: Self-pay | Admitting: Family Medicine

## 2020-05-13 ENCOUNTER — Telehealth (INDEPENDENT_AMBULATORY_CARE_PROVIDER_SITE_OTHER): Payer: Worker's Compensation | Admitting: Family Medicine

## 2020-05-13 ENCOUNTER — Other Ambulatory Visit: Payer: Self-pay

## 2020-05-13 VITALS — Ht 67.0 in | Wt 201.0 lb

## 2020-05-13 DIAGNOSIS — Z09 Encounter for follow-up examination after completed treatment for conditions other than malignant neoplasm: Secondary | ICD-10-CM

## 2020-05-13 DIAGNOSIS — R7303 Prediabetes: Secondary | ICD-10-CM

## 2020-05-13 DIAGNOSIS — F419 Anxiety disorder, unspecified: Secondary | ICD-10-CM

## 2020-05-13 DIAGNOSIS — Z760 Encounter for issue of repeat prescription: Secondary | ICD-10-CM

## 2020-05-13 DIAGNOSIS — E039 Hypothyroidism, unspecified: Secondary | ICD-10-CM

## 2020-05-13 DIAGNOSIS — R739 Hyperglycemia, unspecified: Secondary | ICD-10-CM

## 2020-05-13 DIAGNOSIS — F32A Depression, unspecified: Secondary | ICD-10-CM

## 2020-05-13 MED ORDER — LEVOTHYROXINE SODIUM 150 MCG PO TABS: 150.0000 ug | ORAL_TABLET | Freq: Every day | ORAL | 3 refills | Status: DC

## 2020-05-13 MED ORDER — METFORMIN HCL 500 MG PO TABS: 500.0000 mg | ORAL_TABLET | Freq: Two times a day (BID) | ORAL | 3 refills | Status: DC

## 2020-05-13 NOTE — Progress Notes (Signed)
Virtual Visit via Telephone Note  I connected with Tammy Green on 05/13/20 at  1:20 PM EST by telephone and verified that I am speaking with the correct person using two identifiers.  Location: Patient: Home Provider: Home   I discussed the limitations, risks, security and privacy concerns of performing an evaluation and management service by telephone and the availability of in person appointments. I also discussed with the patient that there may be a patient responsible charge related to this service. The patient expressed understanding and agreed to proceed.   History of Present Illness:  Patient Active Problem List   Diagnosis Date Noted  . Weight loss 10/10/2019  . Prediabetes 07/12/2019  . Hyperglycemia 07/12/2019  . Irregular menstrual bleeding 07/12/2019  . Hypothyroidism 08/07/2014  . Depression with anxiety 08/07/2014  . History of depression 06/18/2014  . History of anxiety state 06/18/2014  . Lump of skin of upper extremity 06/18/2014  . Kidney stones 03/30/2012   Current Status: Since her last office visit, she is doing well with no complaints. She has c/o mild cold symptoms, but is currently taking OTC medications. She is taking Motrin for occasional headaches. She denies fevers, chills, fatigue, recent infections, weight loss, and night sweats. She has not had any visual changes, dizziness, and falls. No chest pain, heart palpitations, cough and shortness of breath reported. Denies GI problems such as nausea, vomiting, diarrhea, and constipation. She has no reports of blood in stools, dysuria and hematuria. No depression or anxiety reported today. She is taking all medications as prescribed. She denies pain today.   Observations/Objective:  Telephone Virtual Visit   Assessment and Plan:  1. Hypothyroidism, unspecified type Patient declines to report for repeat labs for Thyroid issues and will wait until her Annual Physical in 06/2020. She will continue Thyroid  medication as prescribed.  - levothyroxine (SYNTHROID) 150 MCG tablet; Take 1 tablet (150 mcg total) by mouth daily before breakfast.  Dispense: 90 tablet; Refill: 3  2. Anxiety and depression  3. Prediabetes She will continue medication as prescribed, to decrease foods/beverages high in sugars and carbs and follow Heart Healthy or DASH diet. Increase physical activity to at least 30 minutes cardio exercise daily.  - metFORMIN (GLUCOPHAGE) 500 MG tablet; Take 1 tablet (500 mg total) by mouth 2 (two) times daily with a meal.  Dispense: 180 tablet; Refill: 3  4. Hyperglycemia - metFORMIN (GLUCOPHAGE) 500 MG tablet; Take 1 tablet (500 mg total) by mouth 2 (two) times daily with a meal.  Dispense: 180 tablet; Refill: 3  5. Medication refill  6. Follow up She will follow up in 06/2020.  Meds ordered this encounter  Medications  . levothyroxine (SYNTHROID) 150 MCG tablet    Sig: Take 1 tablet (150 mcg total) by mouth daily before breakfast.    Dispense:  90 tablet    Refill:  3  . metFORMIN (GLUCOPHAGE) 500 MG tablet    Sig: Take 1 tablet (500 mg total) by mouth 2 (two) times daily with a meal.    Dispense:  180 tablet    Refill:  3    No orders of the defined types were placed in this encounter.   Referral Orders  No referral(s) requested today    Raliegh Ip, MSN, ANE, FNP-BC [facility] [address] [place], [place] [address] [phone] fax     I discussed the assessment and treatment plan with the patient.  The patient was provided an opportunity to ask questions and all were answered. The patient agreed with the plan and demonstrated an understanding of the instructions.   The patient was advised to call back or seek an in-person evaluation if the symptoms worsen or if the condition fails to improve as anticipated.  I provided 20 minutes of non-face-to-face time  during this encounter.  Kallie Locks, FNP

## 2020-05-13 NOTE — Addendum Note (Signed)
Addended by: Kallie Locks on: 05/13/2020 08:45 PM   Modules accepted: Level of Service

## 2020-05-15 ENCOUNTER — Telehealth: Payer: Self-pay | Admitting: Family Medicine

## 2020-05-15 NOTE — Telephone Encounter (Signed)
Called pt to ask if she has had her covid vaccine. Pt states she does not want to talk to me about a covid vaccine or anybody else either.

## 2020-07-18 ENCOUNTER — Ambulatory Visit: Payer: Self-pay | Admitting: Family Medicine

## 2021-01-21 ENCOUNTER — Other Ambulatory Visit: Payer: Self-pay

## 2021-01-21 ENCOUNTER — Ambulatory Visit (INDEPENDENT_AMBULATORY_CARE_PROVIDER_SITE_OTHER): Payer: Self-pay | Admitting: Nurse Practitioner

## 2021-01-21 ENCOUNTER — Encounter: Payer: Self-pay | Admitting: Nurse Practitioner

## 2021-01-21 VITALS — BP 95/60 | HR 61 | Temp 97.2°F | Ht 67.0 in | Wt 194.0 lb

## 2021-01-21 DIAGNOSIS — L600 Ingrowing nail: Secondary | ICD-10-CM

## 2021-01-21 DIAGNOSIS — E039 Hypothyroidism, unspecified: Secondary | ICD-10-CM

## 2021-01-21 DIAGNOSIS — Z0000 Encounter for general adult medical examination without abnormal findings: Secondary | ICD-10-CM

## 2021-01-21 DIAGNOSIS — R8290 Unspecified abnormal findings in urine: Secondary | ICD-10-CM

## 2021-01-21 DIAGNOSIS — R2232 Localized swelling, mass and lump, left upper limb: Secondary | ICD-10-CM

## 2021-01-21 LAB — POCT URINALYSIS DIP (CLINITEK)
Bilirubin, UA: NEGATIVE
Glucose, UA: NEGATIVE mg/dL
Ketones, POC UA: NEGATIVE mg/dL
Leukocytes, UA: NEGATIVE
Nitrite, UA: POSITIVE — AB
POC PROTEIN,UA: NEGATIVE
Spec Grav, UA: >=1.03 — AB (ref 1.010–1.025)
Urobilinogen, UA: 0.2 E.U./dL
pH, UA: 6 (ref 5.0–8.0)

## 2021-01-21 LAB — POCT GLYCOSYLATED HEMOGLOBIN (HGB A1C): Hemoglobin A1C: 5.3 % (ref 4.0–5.6)

## 2021-01-21 MED ORDER — LIDOCAINE HCL (PF) 2 % IJ SOLN: 4.0000 mL | Freq: Once | INTRAMUSCULAR | Status: AC

## 2021-01-21 NOTE — Patient Instructions (Signed)
You were seen today in the [facility] for masses, ingrown nail and hyperthyroidism. Labs were collected, results will be available via MyChart or, if abnormal, you will be contacted by clinic staff. You were prescribed medications, please take as directed. Please follow up in 6 wks  for reevaluation.

## 2021-01-21 NOTE — Progress Notes (Signed)
[place], [place]  [address] Phone:  [phone]   Fax:  [phone] Subjective:   Patient ID: Tammy Green, female    DOB: 07/26/1975, 45 y.o.   MRN: [id number]  Chief Complaint  Patient presents with   Follow-up    Right toe pain, lumps on both arms, back and legs. Needing thyroid checked,    HPI GERALDYN SHAIN 45 y.o. female presents with history of anxiety, depression, kidney stones, prediabetes and thyroid disease to the [facility] for pain in right toe and labs.  Patient states that she has had worsening pain in the right great toe x 53mh after getting a pedicure. Currently rates pain 9/10 and describes as throbbing. Pain worsens with prolonged standing and palpation. Attempted to soak in alcohol with no improvement in symptoms.   Also concerned about multiple masses on her body. States that several years ago she was diagnosed with fatty cyst in the left upper arm. Over the past two weeks this cyst has increased in size and become painful. Has also developed additional cysts on the back, arms and legs, which are painful at times. Denies any pain during visit, states that it mostly occurs with palpation. Has not taken any medications for symptoms. Denies any improving factors.  Requesting labs and refill of thyroid medications. States that her thyroid levels were last checked 1 yr ago. Last dose of medication 3 days ago.   Denies any chest pain or shortness of breath. Endorses intermittent HA's over the past few weeks, mild. Denies any HA today. Also endorses intermittent numbness and tingling in bilateral fingers x 6 mths.   Past Medical History:  Diagnosis Date   Anxiety    Depression    Irregular menstrual bleeding 06/2019   Kidney stones 2013   Prediabetes 06/2019   Thyroid disease     Past Surgical History:  Procedure Laterality Date   FEMUR FRACTURE SURGERY     WISDOM TOOTH EXTRACTION      Family History  Problem  Relation Age of Onset   Bipolar disorder Mother    Bipolar disorder Sister    Bipolar disorder Brother    Diabetes Maternal Grandmother    Cancer Maternal Grandfather     Social History   Socioeconomic History   Marital status: Single    Spouse name: Not on file   Number of children: Not on file   Years of education: Not on file   Highest education level: Not on file  Occupational History   Not on file  Tobacco Use   Smoking status: Former    Packs/day: 0.25    Years: 18.00    Pack years: 4.50    Types: Cigarettes    Quit date: 10/15/2018    Years since quitting: 2.2   Smokeless tobacco: Former    Quit date: 11/15/2011  Vaping Use   Vaping Use: Every day   Substances: Nicotine, Flavoring  Substance and Sexual Activity   Alcohol use: Yes    Comment: occ   Drug use: No   Sexual activity: Yes    Birth control/protection: None    Comment: not since she conceived  Other Topics Concern   Not on file  Social History Narrative   Not on file   Social Determinants of Health   Financial Resource Strain: Not on file  Food Insecurity: Not on file  Transportation Needs: Not on file  Physical Activity: Not on file  Stress:  Not on file  Social Connections: Not on file  Intimate Partner Violence: Not on file    Outpatient Medications Prior to Visit  Medication Sig Dispense Refill   COLLAGEN-CHOND-HYALURONIC ACID PO Take by mouth.     Flaxseed, Linseed, (FLAX SEED OIL PO) Take by mouth.     Misc Natural Products (APPLE CIDER VINEGAR DIET PO) Take by mouth.     Multiple Vitamins-Minerals (MULTIVITAMIN WITH MINERALS) tablet Take 1 tablet by mouth daily.     Black Cohosh 175 MG CAPS Take by mouth.      levothyroxine (SYNTHROID) 150 MCG tablet Take 1 tablet (150 mcg total) by mouth daily before breakfast. 90 tablet 3   metFORMIN (GLUCOPHAGE) 500 MG tablet Take 1 tablet (500 mg total) by mouth 2 (two) times daily with a meal. 180 tablet 3   busPIRone (BUSPAR) 10 MG tablet Take 1  tablet (10 mg total) by mouth 3 (three) times daily. 90 tablet 1   cephALEXin (KEFLEX) 500 MG capsule Take 1 capsule (500 mg total) by mouth 4 (four) times daily. 28 capsule 2   OVER THE COUNTER MEDICATION Estrogen tablet and liquid     PARoxetine (PAXIL) 30 MG tablet Take 2 tablets (60 mg total) by mouth every morning. Take two 30 tablets daily. (Patient not taking: Reported on 05/13/2020) 60 tablet 3   St Johns Wort 450 MG CAPS Take by mouth.     No facility-administered medications prior to visit.    Allergies  Allergen Reactions   Nizatidine Hives   Penicillin G Hives   Sulfamethoxazole Other (See Comments), Hives and Rash    fever fever fever   Codeine Hives    Pt states she can take Percocet but can not take Vicodin    Review of Systems  Constitutional:  Negative for chills, fever and malaise/fatigue.  HENT: Negative.    Respiratory:  Negative for cough and shortness of breath.   Cardiovascular:  Negative for chest pain, palpitations and leg swelling.  Gastrointestinal:  Negative for abdominal pain, blood in stool, constipation, diarrhea, nausea and vomiting.  Genitourinary: Negative.   Musculoskeletal:        Right toe pain. See HPI  Skin:        See HPI  Neurological:  Positive for tingling and headaches.       See HPI  Endo/Heme/Allergies: Negative.   Psychiatric/Behavioral:  Negative for depression. The patient is not nervous/anxious.   All other systems reviewed and are negative.     Objective:    Physical Exam Constitutional:      General: She is not in acute distress.    Appearance: Normal appearance. She is normal weight.  HENT:     Head: Normocephalic.     Right Ear: Tympanic membrane, ear canal and external ear normal.     Left Ear: Tympanic membrane, ear canal and external ear normal.     Nose: Nose normal.     Mouth/Throat:     Mouth: Mucous membranes are moist.     Pharynx: Oropharynx is clear.  Eyes:     Extraocular Movements: Extraocular  movements intact.     Conjunctiva/sclera: Conjunctivae normal.     Pupils: Pupils are equal, round, and reactive to light.  Cardiovascular:     Rate and Rhythm: Normal rate and regular rhythm.     Pulses: Normal pulses.     Heart sounds: Normal heart sounds.     Comments: No obvious peripheral edema Pulmonary:  Effort: Pulmonary effort is normal.     Breath sounds: Normal breath sounds.  Musculoskeletal:        General: Normal range of motion.     Cervical back: Normal range of motion and neck supple.  Skin:    General: Skin is warm and dry.     Capillary Refill: Capillary refill takes less than 2 seconds.     Comments: Solid masses identified, all 1-2 cm, located on diffuse back, BUE and bilateral thighs. All are non tender to palpation. No erythema or other discoloration noted. Masses are fixed. Mild swelling noted to right nail bed, area tender to palpation, consistent with ingrown nail, without infection. No drainage noted, skin on nail bed intact   Neurological:     General: No focal deficit present.     Mental Status: She is alert and oriented to person, place, and time. Mental status is at baseline.  Psychiatric:        Mood and Affect: Mood normal.        Behavior: Behavior normal.        Thought Content: Thought content normal.        Judgment: Judgment normal.    BP 95/60   Pulse 61   Temp (!) 97.2 F (36.2 C)   Ht '5\' 7"'  (1.702 m)   Wt 194 lb 0.6 oz (88 kg)   LMP 01/14/2021   SpO2 98%   BMI 30.39 kg/m  Wt Readings from Last 3 Encounters:  01/21/21 194 lb 0.6 oz (88 kg)  05/13/20 201 lb (91.2 kg)  12/27/19 201 lb (91.2 kg)    Immunization History  Administered Date(s) Administered   Influenza,inj,Quad PF,6+ Mos 06/13/2014   Tdap 11/25/2011, 09/27/2017    Diabetic Foot Exam - Simple   No data filed     Lab Results  Component Value Date   TSH 4.060 01/21/2021   Lab Results  Component Value Date   WBC 8.9 01/21/2021   HGB 14.2 01/21/2021   HCT  42.3 01/21/2021   MCV 94 01/21/2021   PLT 372 01/21/2021   Lab Results  Component Value Date   NA 139 01/21/2021   K 4.4 01/21/2021   CO2 25 01/21/2021   GLUCOSE 79 01/21/2021   BUN 11 01/21/2021   CREATININE 0.79 01/21/2021   BILITOT 0.4 01/21/2021   ALKPHOS 64 01/21/2021   AST 16 01/21/2021   ALT 13 01/21/2021   PROT 7.2 01/21/2021   ALBUMIN 4.5 01/21/2021   CALCIUM 9.4 01/21/2021   ANIONGAP 7 12/30/2017   EGFR 94 01/21/2021   Lab Results  Component Value Date   CHOL 151 01/21/2021   CHOL 202 (H) 07/10/2019   CHOL 155 05/16/2018   Lab Results  Component Value Date   HDL 52 01/21/2021   HDL 52 07/10/2019   HDL 40 05/16/2018   Lab Results  Component Value Date   LDLCALC 78 01/21/2021   LDLCALC 129 (H) 07/10/2019   LDLCALC 85 05/16/2018   Lab Results  Component Value Date   TRIG 117 01/21/2021   TRIG 116 07/10/2019   TRIG 148 05/16/2018   Lab Results  Component Value Date   CHOLHDL 2.9 01/21/2021   CHOLHDL 3.9 07/10/2019   CHOLHDL 3.9 05/16/2018   Lab Results  Component Value Date   HGBA1C 5.3 01/21/2021   HGBA1C 5.4 10/10/2019   HGBA1C 5.4 10/10/2019   HGBA1C 5.4 (A) 10/10/2019   HGBA1C 5.4 10/10/2019       Assessment &  Plan:   Problem List Items Addressed This Visit   Procedure: Removal of Ingrown Nail Verbal consent given by patient after discussing risk and benefits  Lidocaine 2% used, digital block completed Partial removal of nail completed, with matrix intact  Patient verbalized pain relief  No immediate complications Antibiotics deferred Discussed at length methods for managing swelling at home     Endocrine   Hypothyroidism   Relevant Orders   TSH (Completed) Per chart review, patient previous TSH level completed 1 yr ago, results were low. Will not refill medication at this time, will wait to review TSH results from labs collected today.     Other Visit Diagnoses     Healthcare maintenance    -  Primary   Relevant Orders    POCT glycosylated hemoglobin (Hb A1C) (Completed)   POCT URINALYSIS DIP (CLINITEK) (Completed)   Lipid panel (Completed)   CBC with Differential/Platelet (Completed)   Comprehensive metabolic panel (Completed) Encouraged continued diet and exercise efforts  Encouraged continued compliance with medication  Metformin discontinued, A1C normal. Per chart review, no history of elevated A1C of blood glucose.   Ingrown toenail of left foot       Relevant Medications   lidocaine HCl (PF) (XYLOCAINE) 2 % injection 4 mL    Mass of arm, left       Relevant Orders   Ambulatory referral to Dermatology   Abnormal urinalysis       Relevant Orders   Urine Culture   Follow up in 1-2 wks as needed for reevaluation of ingrown toe nail, 6 wks for repeat TSH level, sooner as needed    I have discontinued Kadasia E. Sanzone's PARoxetine, busPIRone, Black Cohosh, St ONEOK, OVER THE COUNTER MEDICATION, cephALEXin, and metFORMIN. I am also having her maintain her (Flaxseed, Linseed, (FLAX SEED OIL PO)), multivitamin with minerals, COLLAGEN-CHOND-HYALURONIC ACID PO, and Misc Natural Products (APPLE CIDER VINEGAR DIET PO). We will continue to administer lidocaine HCl (PF).  Meds ordered this encounter  Medications   lidocaine HCl (PF) (XYLOCAINE) 2 % injection 4 mL     Teena Dunk, NP

## 2021-01-22 ENCOUNTER — Other Ambulatory Visit: Payer: Self-pay | Admitting: Nurse Practitioner

## 2021-01-22 DIAGNOSIS — E039 Hypothyroidism, unspecified: Secondary | ICD-10-CM

## 2021-01-22 LAB — LIPID PANEL
Chol/HDL Ratio: 2.9 ratio (ref 0.0–4.4)
Cholesterol, Total: 151 mg/dL (ref 100–199)
HDL: 52 mg/dL (ref >39)
LDL Chol Calc (NIH): 78 mg/dL (ref 0–99)
Triglycerides: 117 mg/dL (ref 0–149)
VLDL Cholesterol Cal: 21 mg/dL (ref 5–40)

## 2021-01-22 LAB — COMPREHENSIVE METABOLIC PANEL
ALT: 13 IU/L (ref 0–32)
AST: 16 IU/L (ref 0–40)
Albumin/Globulin Ratio: 1.7 (ref 1.2–2.2)
Albumin: 4.5 g/dL (ref 3.8–4.8)
Alkaline Phosphatase: 64 IU/L (ref 44–121)
BUN/Creatinine Ratio: 14 (ref 9–23)
BUN: 11 mg/dL (ref 6–24)
Bilirubin Total: 0.4 mg/dL (ref 0.0–1.2)
CO2: 25 mmol/L (ref 20–29)
Calcium: 9.4 mg/dL (ref 8.7–10.2)
Chloride: 100 mmol/L (ref 96–106)
Creatinine, Ser: 0.79 mg/dL (ref 0.57–1.00)
Globulin, Total: 2.7 g/dL (ref 1.5–4.5)
Glucose: 79 mg/dL (ref 70–99)
Potassium: 4.4 mmol/L (ref 3.5–5.2)
Sodium: 139 mmol/L (ref 134–144)
Total Protein: 7.2 g/dL (ref 6.0–8.5)
eGFR: 94 mL/min/{1.73_m2} (ref >59)

## 2021-01-22 LAB — CBC WITH DIFFERENTIAL/PLATELET
Basophils Absolute: 0 10*3/uL (ref 0.0–0.2)
Basos: 0 %
EOS (ABSOLUTE): 0.1 10*3/uL (ref 0.0–0.4)
Eos: 1 %
Hematocrit: 42.3 % (ref 34.0–46.6)
Hemoglobin: 14.2 g/dL (ref 11.1–15.9)
Immature Grans (Abs): 0 10*3/uL (ref 0.0–0.1)
Immature Granulocytes: 0 %
Lymphocytes Absolute: 2.1 10*3/uL (ref 0.7–3.1)
Lymphs: 23 %
MCH: 31.7 pg (ref 26.6–33.0)
MCHC: 33.6 g/dL (ref 31.5–35.7)
MCV: 94 fL (ref 79–97)
Monocytes Absolute: 0.6 10*3/uL (ref 0.1–0.9)
Monocytes: 7 %
Neutrophils Absolute: 6.1 10*3/uL (ref 1.4–7.0)
Neutrophils: 69 %
Platelets: 372 10*3/uL (ref 150–450)
RBC: 4.48 x10E6/uL (ref 3.77–5.28)
RDW: 12.8 % (ref 11.7–15.4)
WBC: 8.9 10*3/uL (ref 3.4–10.8)

## 2021-01-22 LAB — TSH: TSH: 4.06 u[IU]/mL (ref 0.450–4.500)

## 2021-01-22 MED ORDER — LEVOTHYROXINE SODIUM 150 MCG PO TABS: 75.0000 ug | ORAL_TABLET | Freq: Every day | ORAL | 0 refills | Status: DC

## 2021-01-26 ENCOUNTER — Other Ambulatory Visit: Payer: Self-pay | Admitting: Nurse Practitioner

## 2021-01-26 DIAGNOSIS — N390 Urinary tract infection, site not specified: Secondary | ICD-10-CM

## 2021-01-26 LAB — URINE CULTURE

## 2021-01-26 MED ORDER — NITROFURANTOIN MONOHYD MACRO 100 MG PO CAPS: 100.0000 mg | ORAL_CAPSULE | Freq: Two times a day (BID) | ORAL | 0 refills | Status: AC

## 2021-02-10 ENCOUNTER — Telehealth: Payer: Self-pay

## 2021-02-10 NOTE — Telephone Encounter (Signed)
Attempted to contact patient regarding dermatology referral.

## 2021-03-03 ENCOUNTER — Other Ambulatory Visit: Payer: Self-pay | Admitting: Nurse Practitioner

## 2021-03-03 DIAGNOSIS — E039 Hypothyroidism, unspecified: Secondary | ICD-10-CM

## 2021-03-04 ENCOUNTER — Other Ambulatory Visit: Payer: Self-pay | Admitting: Nurse Practitioner

## 2021-03-04 ENCOUNTER — Other Ambulatory Visit: Payer: Self-pay

## 2021-03-04 DIAGNOSIS — E039 Hypothyroidism, unspecified: Secondary | ICD-10-CM

## 2021-03-31 ENCOUNTER — Telehealth: Payer: Self-pay

## 2021-03-31 NOTE — Telephone Encounter (Signed)
Uti infection med to be refilled   [facility] on [address]

## 2021-04-01 NOTE — Telephone Encounter (Signed)
Appointment scheduled.

## 2021-04-03 ENCOUNTER — Other Ambulatory Visit: Payer: Self-pay

## 2021-04-03 ENCOUNTER — Ambulatory Visit (INDEPENDENT_AMBULATORY_CARE_PROVIDER_SITE_OTHER): Payer: Self-pay | Admitting: Nurse Practitioner

## 2021-04-03 ENCOUNTER — Encounter: Payer: Self-pay | Admitting: Nurse Practitioner

## 2021-04-03 VITALS — BP 111/75 | HR 71 | Temp 98.0°F | Ht 67.0 in | Wt 206.0 lb

## 2021-04-03 DIAGNOSIS — R300 Dysuria: Secondary | ICD-10-CM

## 2021-04-03 DIAGNOSIS — N390 Urinary tract infection, site not specified: Secondary | ICD-10-CM

## 2021-04-03 LAB — POCT URINALYSIS DIP (CLINITEK)
Bilirubin, UA: NEGATIVE
Glucose, UA: NEGATIVE mg/dL
Ketones, POC UA: NEGATIVE mg/dL
Nitrite, UA: POSITIVE — AB
Spec Grav, UA: >=1.03 — AB (ref 1.010–1.025)
Urobilinogen, UA: 0.2 E.U./dL
pH, UA: 6.5 (ref 5.0–8.0)

## 2021-04-03 MED ORDER — NITROFURANTOIN MONOHYD MACRO 100 MG PO CAPS: 100.0000 mg | ORAL_CAPSULE | Freq: Two times a day (BID) | ORAL | 0 refills | Status: AC

## 2021-04-03 NOTE — Patient Instructions (Signed)
You were seen today in the [facility] for dysuria. Labs were collected, results will be available via MyChart or, if abnormal, you will be contacted by clinic staff. You were prescribed medications, please take as directed. Please follow up as needed

## 2021-04-03 NOTE — Progress Notes (Signed)
[place], [place]  [address] Phone:  [phone]   Fax:  [phone] Subjective:   Patient ID: Tammy Green, female    DOB: 1975-08-26, 45 y.o.   MRN: [id number]  Chief Complaint  Patient presents with   Follow-up    Pt stated she came in for possible UTI. No other issues or concerns    HPI [name] 45 y.o. female  has a past medical history of Anxiety, Depression, Irregular menstrual bleeding (06/2019), Kidney stones (2013), Prediabetes (06/2019), and Thyroid disease. To the [facility] for dysuria and acute on chronic UTI.  Patient states that she gets UTI every few months for the past several years, related to frequently retaining urine during work. States that she has had urinary frequency, urgency , lower abdominal pressure and burning with urination x 1-2 days. Has been taking Azo with moderate relief in symptoms. Denies any pain today, states that she took Azo this morning. Indicates that she generally drinks water regularly and tries to urinate at work when she gets the urge. Denies any recent changes in habits. Denies any other complaints today.  Denies any fatigue, chest pain, shortness of breath, HA or dizziness. Denies any blurred vision, numbness or tingling.   Past Medical History:  Diagnosis Date   Anxiety    Depression    Irregular menstrual bleeding 06/2019   Kidney stones 2013   Prediabetes 06/2019   Thyroid disease     Past Surgical History:  Procedure Laterality Date   FEMUR FRACTURE SURGERY     WISDOM TOOTH EXTRACTION      Family History  Problem Relation Age of Onset   Bipolar disorder Mother    Bipolar disorder Sister    Bipolar disorder Brother    Diabetes Maternal Grandmother    Cancer Maternal Grandfather     Social History   Socioeconomic History   Marital status: Single    Spouse name: Not on file   Number of children: Not on file   Years of education: Not on file   Highest education  level: Not on file  Occupational History   Not on file  Tobacco Use   Smoking status: Former    Packs/day: 0.25    Years: 18.00    Pack years: 4.50    Types: Cigarettes    Quit date: 10/15/2018    Years since quitting: 2.4   Smokeless tobacco: Former    Quit date: 11/15/2011  Vaping Use   Vaping Use: Every day   Substances: Nicotine, Flavoring  Substance and Sexual Activity   Alcohol use: Yes    Comment: occ   Drug use: No   Sexual activity: Yes    Birth control/protection: None    Comment: not since she conceived  Other Topics Concern   Not on file  Social History Narrative   Not on file   Social Determinants of Health   Financial Resource Strain: Not on file  Food Insecurity: Not on file  Transportation Needs: Not on file  Physical Activity: Not on file  Stress: Not on file  Social Connections: Not on file  Intimate Partner Violence: Not on file    Outpatient Medications Prior to Visit  Medication Sig Dispense Refill   COLLAGEN-CHOND-HYALURONIC ACID PO Take by mouth.     levothyroxine (SYNTHROID) 150 MCG tablet TAKE 1/2 (ONE-HALF) TABLET BY MOUTH ONCE DAILY BEFORE BREAKFAST 21 tablet 0   Flaxseed, Linseed, (FLAX SEED OIL PO)  Take by mouth. (Patient not taking: Reported on 04/03/2021)     Misc Natural Products (APPLE CIDER VINEGAR DIET PO) Take by mouth. (Patient not taking: Reported on 04/03/2021)     Multiple Vitamins-Minerals (MULTIVITAMIN WITH MINERALS) tablet Take 1 tablet by mouth daily. (Patient not taking: Reported on 04/03/2021)     Facility-Administered Medications Prior to Visit  Medication Dose Route Frequency Provider Last Rate Last Admin   lidocaine HCl (PF) (XYLOCAINE) 2 % injection 4 mL  4 mL Infiltration Once Kelwin Gibler I, NP        Allergies  Allergen Reactions   Nizatidine Hives   Penicillin G Hives   Sulfamethoxazole Other (See Comments), Hives and Rash    fever fever fever   Codeine Hives    Pt states she can take Percocet but can  not take Vicodin    Review of Systems  Constitutional:  Negative for chills, fever and malaise/fatigue.  Respiratory:  Negative for cough and shortness of breath.   Cardiovascular:  Negative for chest pain, palpitations and leg swelling.  Gastrointestinal:  Negative for abdominal pain, blood in stool, constipation, diarrhea, nausea and vomiting.  Genitourinary:  Positive for dysuria, frequency and urgency. Negative for flank pain and hematuria.  Skin: Negative.   Neurological: Negative.   Psychiatric/Behavioral:  Negative for depression. The patient is not nervous/anxious.   All other systems reviewed and are negative.     Objective:    Physical Exam Vitals reviewed.  Constitutional:      General: She is not in acute distress.    Appearance: Normal appearance. She is normal weight.  HENT:     Head: Normocephalic.  Cardiovascular:     Rate and Rhythm: Normal rate and regular rhythm.     Pulses: Normal pulses.     Heart sounds: Normal heart sounds.     Comments: No obvious peripheral edema Pulmonary:     Effort: Pulmonary effort is normal.     Breath sounds: Normal breath sounds.  Abdominal:     Tenderness: There is left CVA tenderness. There is no right CVA tenderness.  Skin:    General: Skin is warm and dry.     Capillary Refill: Capillary refill takes less than 2 seconds.  Neurological:     General: No focal deficit present.     Mental Status: She is alert and oriented to person, place, and time.  Psychiatric:        Mood and Affect: Mood normal.        Behavior: Behavior normal.        Thought Content: Thought content normal.        Judgment: Judgment normal.    BP 111/75   Pulse 71   Temp 98 F (36.7 C)   Ht '5\' 7"'  (1.702 m)   Wt 206 lb (93.4 kg)   SpO2 100%   BMI 32.26 kg/m  Wt Readings from Last 3 Encounters:  04/03/21 206 lb (93.4 kg)  01/21/21 194 lb 0.6 oz (88 kg)  05/13/20 201 lb (91.2 kg)    Immunization History  Administered Date(s)  Administered   Influenza,inj,Quad PF,6+ Mos 06/13/2014   Tdap 11/25/2011, 09/27/2017    Diabetic Foot Exam - Simple   No data filed     Lab Results  Component Value Date   TSH 4.060 01/21/2021   Lab Results  Component Value Date   WBC 8.9 01/21/2021   HGB 14.2 01/21/2021   HCT 42.3 01/21/2021   MCV 94 01/21/2021  PLT 372 01/21/2021   Lab Results  Component Value Date   NA 139 01/21/2021   K 4.4 01/21/2021   CO2 25 01/21/2021   GLUCOSE 79 01/21/2021   BUN 11 01/21/2021   CREATININE 0.79 01/21/2021   BILITOT 0.4 01/21/2021   ALKPHOS 64 01/21/2021   AST 16 01/21/2021   ALT 13 01/21/2021   PROT 7.2 01/21/2021   ALBUMIN 4.5 01/21/2021   CALCIUM 9.4 01/21/2021   ANIONGAP 7 12/30/2017   EGFR 94 01/21/2021   Lab Results  Component Value Date   CHOL 151 01/21/2021   CHOL 202 (H) 07/10/2019   CHOL 155 05/16/2018   Lab Results  Component Value Date   HDL 52 01/21/2021   HDL 52 07/10/2019   HDL 40 05/16/2018   Lab Results  Component Value Date   LDLCALC 78 01/21/2021   LDLCALC 129 (H) 07/10/2019   LDLCALC 85 05/16/2018   Lab Results  Component Value Date   TRIG 117 01/21/2021   TRIG 116 07/10/2019   TRIG 148 05/16/2018   Lab Results  Component Value Date   CHOLHDL 2.9 01/21/2021   CHOLHDL 3.9 07/10/2019   CHOLHDL 3.9 05/16/2018   Lab Results  Component Value Date   HGBA1C 5.3 01/21/2021   HGBA1C 5.4 10/10/2019   HGBA1C 5.4 10/10/2019   HGBA1C 5.4 (A) 10/10/2019   HGBA1C 5.4 10/10/2019       Assessment & Plan:   Problem List Items Addressed This Visit   None Visit Diagnoses     Dysuria    -  Primary   Relevant Medications   nitrofurantoin, macrocrystal-monohydrate, (MACROBID) 100 MG capsule   Other Relevant Orders   POCT URINALYSIS DIP (CLINITEK) (Completed)   Chronic UTI       Relevant Medications   nitrofurantoin, macrocrystal-monohydrate, (MACROBID) 100 MG capsule   Other Relevant Orders   POCT URINALYSIS DIP (CLINITEK)  (Completed) Informed to continue taking OTC medications as needed for symptoms  Encouraged further evaluation of chronic UTI by specialist, patient declines at this time, indicating that she has no insurance. Will revisit at follow up.   Follow up at 1-2 wks as needed, if symptoms worsen or do not improve    I am having Nyra Capes start on nitrofurantoin (macrocrystal-monohydrate). I am also having her maintain her (Flaxseed, Linseed, (FLAX SEED OIL PO)), multivitamin with minerals, COLLAGEN-CHOND-HYALURONIC ACID PO, Misc Natural Products (APPLE CIDER VINEGAR DIET PO), and levothyroxine. We will continue to administer lidocaine HCl (PF).  Meds ordered this encounter  Medications   nitrofurantoin, macrocrystal-monohydrate, (MACROBID) 100 MG capsule    Sig: Take 1 capsule (100 mg total) by mouth 2 (two) times daily for 7 days.    Dispense:  14 capsule    Refill:  0     Teena Dunk, NP

## 2021-06-08 DIAGNOSIS — R109 Unspecified abdominal pain: Secondary | ICD-10-CM | POA: Diagnosis not present

## 2021-06-08 DIAGNOSIS — Z6834 Body mass index (BMI) 34.0-34.9, adult: Secondary | ICD-10-CM | POA: Diagnosis not present

## 2021-06-08 DIAGNOSIS — R229 Localized swelling, mass and lump, unspecified: Secondary | ICD-10-CM | POA: Diagnosis not present

## 2021-06-08 DIAGNOSIS — Z0000 Encounter for general adult medical examination without abnormal findings: Secondary | ICD-10-CM | POA: Diagnosis not present

## 2021-06-08 DIAGNOSIS — M549 Dorsalgia, unspecified: Secondary | ICD-10-CM | POA: Diagnosis not present

## 2021-06-08 DIAGNOSIS — R69 Illness, unspecified: Secondary | ICD-10-CM | POA: Diagnosis not present

## 2021-06-08 DIAGNOSIS — E039 Hypothyroidism, unspecified: Secondary | ICD-10-CM | POA: Diagnosis not present

## 2021-06-08 DIAGNOSIS — E119 Type 2 diabetes mellitus without complications: Secondary | ICD-10-CM | POA: Diagnosis not present

## 2021-06-08 NOTE — Progress Notes (Deleted)
°  Subjective:    Tammy Green - 46 y.o. female MRN [id number]  Date of birth: 1975/10/06  HPI  Tammy Green is to establish care.   Current issues and/or concerns:  ROS per HPI     Health Maintenance:  Health Maintenance Due  Topic Date Due   COVID-19 Vaccine (1) Never done   COLONOSCOPY (Pts 45-1yrs Insurance coverage will need to be confirmed)  Never done     Past Medical History: Patient Active Problem List   Diagnosis Date Noted   Weight loss 10/10/2019   Prediabetes 07/12/2019   Hyperglycemia 07/12/2019   Irregular menstrual bleeding 07/12/2019   Aftercare 10/12/2017   Nondisplaced fracture of distal phalanx of left thumb, initial encounter for open fracture 10/12/2017   Hypothyroidism 08/07/2014   Depression with anxiety 08/07/2014   History of depression 06/18/2014   History of anxiety state 06/18/2014   Lump of skin of upper extremity 06/18/2014   Kidney stones 03/30/2012      Social History   reports that she quit smoking about 2 years ago. Her smoking use included cigarettes. She has a 4.50 pack-year smoking history. She quit smokeless tobacco use about 9 years ago. She reports current alcohol use. She reports that she does not use drugs.   Family History  family history includes Bipolar disorder in her brother, mother, and sister; Cancer in her maternal grandfather; Diabetes in her maternal grandmother.   Medications: reviewed and updated   Objective:   Physical Exam There were no vitals taken for this visit. Physical Exam      Assessment & Plan:         Patient was given clear instructions to go to Emergency Department or return to medical center if symptoms don't improve, worsen, or new problems develop.The patient verbalized understanding.  I discussed the assessment and treatment plan with the patient. The patient was provided an opportunity to ask questions and all were answered. The patient agreed with the plan and demonstrated  an understanding of the instructions.   The patient was advised to call back or seek an in-person evaluation if the symptoms worsen or if the condition fails to improve as anticipated.    Ricky Stabs, NP 06/08/2021, 12:12 AM Primary Care at [facility]

## 2021-06-10 ENCOUNTER — Ambulatory Visit: Payer: Medicaid Other | Admitting: Family

## 2021-06-22 ENCOUNTER — Other Ambulatory Visit: Payer: Self-pay

## 2021-06-22 DIAGNOSIS — Z1231 Encounter for screening mammogram for malignant neoplasm of breast: Secondary | ICD-10-CM

## 2021-07-06 ENCOUNTER — Other Ambulatory Visit: Payer: Self-pay

## 2021-07-06 ENCOUNTER — Other Ambulatory Visit: Payer: Self-pay | Admitting: Family Medicine

## 2021-07-06 ENCOUNTER — Ambulatory Visit
Admission: RE | Admit: 2021-07-06 | Discharge: 2021-07-06 | Disposition: A | Discharge status: Home / Self Care | Payer: 59 | Source: Ambulatory Visit

## 2021-07-06 DIAGNOSIS — Z1231 Encounter for screening mammogram for malignant neoplasm of breast: Secondary | ICD-10-CM

## 2021-08-10 DIAGNOSIS — E039 Hypothyroidism, unspecified: Secondary | ICD-10-CM | POA: Diagnosis not present

## 2021-08-10 DIAGNOSIS — Z1211 Encounter for screening for malignant neoplasm of colon: Secondary | ICD-10-CM | POA: Diagnosis not present

## 2021-09-03 DIAGNOSIS — M542 Cervicalgia: Secondary | ICD-10-CM | POA: Diagnosis not present

## 2021-09-03 DIAGNOSIS — R519 Headache, unspecified: Secondary | ICD-10-CM | POA: Diagnosis not present

## 2021-09-07 ENCOUNTER — Other Ambulatory Visit: Payer: Self-pay | Admitting: Family Medicine

## 2021-09-07 DIAGNOSIS — R519 Headache, unspecified: Secondary | ICD-10-CM

## 2021-09-23 ENCOUNTER — Ambulatory Visit
Admission: RE | Admit: 2021-09-23 | Discharge: 2021-09-23 | Disposition: A | Discharge status: Home / Self Care | Payer: 59 | Source: Ambulatory Visit | Attending: Family Medicine | Admitting: Family Medicine

## 2021-09-23 DIAGNOSIS — R519 Headache, unspecified: Secondary | ICD-10-CM | POA: Diagnosis not present

## 2021-09-23 MED ORDER — GADOBENATE DIMEGLUMINE 529 MG/ML IV SOLN
18.0000 mL | Freq: Once | INTRAVENOUS | Status: AC | PRN
  Administered 2021-09-23: 18 mL via INTRAVENOUS

## 2021-09-28 DIAGNOSIS — E039 Hypothyroidism, unspecified: Secondary | ICD-10-CM | POA: Diagnosis not present

## 2021-10-19 DIAGNOSIS — D225 Melanocytic nevi of trunk: Secondary | ICD-10-CM | POA: Diagnosis not present

## 2021-10-19 DIAGNOSIS — D171 Benign lipomatous neoplasm of skin and subcutaneous tissue of trunk: Secondary | ICD-10-CM | POA: Diagnosis not present

## 2021-10-19 DIAGNOSIS — D1721 Benign lipomatous neoplasm of skin and subcutaneous tissue of right arm: Secondary | ICD-10-CM | POA: Diagnosis not present

## 2021-10-19 DIAGNOSIS — D1723 Benign lipomatous neoplasm of skin and subcutaneous tissue of right leg: Secondary | ICD-10-CM | POA: Diagnosis not present

## 2021-10-19 DIAGNOSIS — D1722 Benign lipomatous neoplasm of skin and subcutaneous tissue of left arm: Secondary | ICD-10-CM | POA: Diagnosis not present

## 2022-02-22 DIAGNOSIS — K921 Melena: Secondary | ICD-10-CM | POA: Diagnosis not present

## 2022-02-22 DIAGNOSIS — R69 Illness, unspecified: Secondary | ICD-10-CM | POA: Diagnosis not present

## 2022-02-22 DIAGNOSIS — E559 Vitamin D deficiency, unspecified: Secondary | ICD-10-CM | POA: Diagnosis not present

## 2022-02-22 DIAGNOSIS — E039 Hypothyroidism, unspecified: Secondary | ICD-10-CM | POA: Diagnosis not present

## 2022-02-22 DIAGNOSIS — G43909 Migraine, unspecified, not intractable, without status migrainosus: Secondary | ICD-10-CM | POA: Diagnosis not present

## 2022-05-12 DIAGNOSIS — J0120 Acute ethmoidal sinusitis, unspecified: Secondary | ICD-10-CM | POA: Diagnosis not present

## 2022-11-02 ENCOUNTER — Ambulatory Visit (INDEPENDENT_AMBULATORY_CARE_PROVIDER_SITE_OTHER): Payer: Medicaid Other | Admitting: Certified Nurse Midwife

## 2022-11-02 ENCOUNTER — Other Ambulatory Visit (HOSPITAL_COMMUNITY)
Admission: RE | Admit: 2022-11-02 | Discharge: 2022-11-02 | Disposition: A | Discharge status: Home / Self Care | Payer: Medicaid Other | Source: Ambulatory Visit | Attending: Certified Nurse Midwife | Admitting: Certified Nurse Midwife

## 2022-11-02 VITALS — BP 114/72 | HR 74 | Ht 66.0 in | Wt 197.2 lb

## 2022-11-02 DIAGNOSIS — Z8659 Personal history of other mental and behavioral disorders: Secondary | ICD-10-CM

## 2022-11-02 DIAGNOSIS — Z01419 Encounter for gynecological examination (general) (routine) without abnormal findings: Secondary | ICD-10-CM | POA: Diagnosis not present

## 2022-11-02 DIAGNOSIS — Z124 Encounter for screening for malignant neoplasm of cervix: Secondary | ICD-10-CM | POA: Insufficient documentation

## 2022-11-02 DIAGNOSIS — N924 Excessive bleeding in the premenopausal period: Secondary | ICD-10-CM

## 2022-11-02 DIAGNOSIS — Z113 Encounter for screening for infections with a predominantly sexual mode of transmission: Secondary | ICD-10-CM

## 2022-11-02 NOTE — Progress Notes (Unsigned)
Pt presents to reestablish care.  Last PAP 12-27-19 Requesting PAP and STD testing.  Last mammogram 07-06-21 Pt reports perimenopausal symptoms and irregular and heavy periods

## 2022-11-03 LAB — HEPATITIS B SURFACE ANTIGEN: Hepatitis B Surface Ag: NEGATIVE

## 2022-11-03 LAB — RPR: RPR Ser Ql: NONREACTIVE

## 2022-11-03 LAB — CERVICOVAGINAL ANCILLARY ONLY
Bacterial Vaginitis (gardnerella): POSITIVE — AB
Candida Glabrata: NEGATIVE
Candida Vaginitis: NEGATIVE
Chlamydia: NEGATIVE
Comment: NEGATIVE
Comment: NEGATIVE
Comment: NEGATIVE
Comment: NEGATIVE
Comment: NEGATIVE
Comment: NORMAL
Neisseria Gonorrhea: NEGATIVE
Trichomonas: NEGATIVE

## 2022-11-03 LAB — CYTOLOGY - PAP: Diagnosis: NEGATIVE

## 2022-11-03 LAB — HEPATITIS C ANTIBODY: Hep C Virus Ab: NONREACTIVE

## 2022-11-03 LAB — HIV ANTIBODY (ROUTINE TESTING W REFLEX): HIV Screen 4th Generation wRfx: NONREACTIVE

## 2022-11-04 ENCOUNTER — Telehealth: Payer: Self-pay | Admitting: Emergency Medicine

## 2022-11-04 MED ORDER — METRONIDAZOLE 500 MG PO TABS: 500.0000 mg | ORAL_TABLET | Freq: Two times a day (BID) | ORAL | 0 refills | Status: DC

## 2022-11-04 NOTE — Telephone Encounter (Signed)
Pt informed of results and Rx. ?

## 2022-11-04 NOTE — Progress Notes (Signed)
GYNECOLOGY OFFICE VISIT NOTE  History:   Tammy Green is a 47 y.o. [id number] here today for reestablishing care. She is to have a well woman exam. She reports new onset heavy menstrual bleeding, and passing clots that started 4 months ago. She reports passing clots that "push her tampons out" She states that she goes through 2-3 boxes of tampons in 4 days. She also reports just getting out of a "bad relationship." She wonders if she is going through the change or has fibroids. She denies pain and abnormal vaginal discharge.    Past Medical History:  Diagnosis Date   Anxiety    Depression    Irregular menstrual bleeding 06/2019   Kidney stones 2013   Prediabetes 06/2019   Thyroid disease     Past Surgical History:  Procedure Laterality Date   FEMUR FRACTURE SURGERY     WISDOM TOOTH EXTRACTION      The following portions of the patient's history were reviewed and updated as appropriate: allergies, current medications, past family history, past medical history, past social history, past surgical history and problem list.   Health Maintenance:  Normal pap and negative HRHPV on 12/27/19.  Normal mammogram in 2023. Needs to be scheduled for 2024 mammogram. .   Review of Systems:  Pertinent items noted in HPI and remainder of comprehensive ROS otherwise negative.  Physical Exam:  BP 114/72   Pulse 74   Ht 5\' 6"  (1.676 m)   Wt 197 lb 3.2 oz (89.4 kg)   BMI 31.83 kg/m  CONSTITUTIONAL: Well-developed, well-nourished female in no acute distress.  HEENT:  Normocephalic, atraumatic. External right and left ear normal. No scleral icterus.  NECK: Normal range of motion, supple, no masses noted on observation SKIN: No rash noted. Not diaphoretic. No erythema. No pallor. MUSCULOSKELETAL: Normal range of motion. No edema noted. NEUROLOGIC: Alert and oriented to person, place, and time. Normal muscle tone coordination. No cranial nerve deficit noted. PSYCHIATRIC: Normal mood and affect.  Normal behavior. Normal judgment and thought content. CARDIOVASCULAR: Normal heart rate noted RESPIRATORY: Effort and breath sounds normal, no problems with respiration noted ABDOMEN: No masses noted. No other overt distention noted.   PELVIC: Normal appearing external genitalia; normal urethral meatus; normal appearing vaginal mucosa and cervix.  No abnormal discharge noted.  Normal uterine size, no other palpable masses, no uterine or adnexal tenderness. Performed in the presence of a chaperone  Labs and Imaging Results for orders placed or performed in visit on 11/02/22 (from the past 168 hour(s))  Cytology - PAP( [place])   Collection Time: 11/02/22 10:05 AM  Result Value Ref Range   Adequacy      Satisfactory for evaluation; transformation zone component PRESENT.   Diagnosis      - Negative for intraepithelial lesion or malignancy (NILM)   Microorganisms Shift in flora suggestive of bacterial vaginosis   Cervicovaginal ancillary only( )   Collection Time: 11/02/22 10:05 AM  Result Value Ref Range   Bacterial Vaginitis (gardnerella) Positive (A)    Chlamydia Negative    Neisseria Gonorrhea Negative    Candida Vaginitis Negative    Candida Glabrata Negative    Trichomonas Negative    Comment Normal Reference Ranger Chlamydia - Negative    Comment      Normal Reference Range Bacterial Vaginosis - Negative   Comment      Normal Reference Range Neisseria Gonorrhea - Negative   Comment Normal Reference Range Candida Species - Negative  Comment Normal Reference Range Candida Galbrata - Negative    Comment Normal Reference Range Trichomonas - Negative   HIV antibody (with reflex)   Collection Time: 11/02/22 10:41 AM  Result Value Ref Range   HIV Screen 4th Generation wRfx Non Reactive Non Reactive  RPR   Collection Time: 11/02/22 10:41 AM  Result Value Ref Range   RPR Ser Ql Non Reactive Non Reactive  Hepatitis B Surface AntiGEN   Collection Time: 11/02/22  10:41 AM  Result Value Ref Range   Hepatitis B Surface Ag Negative Negative  Hepatitis C Antibody   Collection Time: 11/02/22 10:41 AM  Result Value Ref Range   Hep C Virus Ab Non Reactive Non Reactive   No results found.    Assessment and Plan:    1. Well woman exam with routine gynecological exam - Patient doing well.   2. Pap smear for cervical cancer screening - Cytology - PAP( [place]) - Cervicovaginal ancillary only( [place]) - HIV antibody (with reflex) - RPR - Hepatitis B Surface AntiGEN - Hepatitis C Antibody  3. Screen for STD (sexually transmitted disease) - STD screening performed today at patient request.   4. Excessive bleeding in premenopausal period - Reviewed that changes in bleeding may be coming from several factors including menopause, stress, etc.  - Discussed watching bleeding on next cycle and if still worrisome, have patient scheduled for a Pelvic US.   5. History of recent stressful life event - Patient endorses getting out of negative relationship and mom guilt and very tearful during visit.  - Ambulatory referral to Integrated Behavioral Health  Routine preventative health maintenance measures emphasized. Please refer to After Visit Summary for other counseling recommendations.   Return in about 1 year (around 11/02/2023) for [place].    I spent 30 minutes dedicated to the care of this patient including pre-visit review of records, face to face time with the patient discussing her conditions and treatments and post visit orders.    Aniyiah Zell Danella Deis) Suzie Portela, MSN, CNM  [facility]  11/04/22 3:52 PM

## 2022-11-08 ENCOUNTER — Ambulatory Visit: Payer: Medicaid Other | Admitting: Licensed Clinical Social Worker

## 2022-11-08 DIAGNOSIS — F320 Major depressive disorder, single episode, mild: Secondary | ICD-10-CM

## 2022-11-09 NOTE — BH Specialist Note (Signed)
Integrated Behavioral Health via Telemedicine Visit  11/09/2022 [name] [id number]  Number of Integrated Behavioral Health Clinician visits: 1 Session Start time:  308pm Session End time: 355pm Total time in minutes: 47 mins via mychart video   Referring Provider: Rhunette Croft CNM Patient/Family location: Home  [facility] Provider location: [facility]  All persons participating in visit: Pt S Tammy Green and LCSW A Oiva Dibari  Types of Service: Individual psychotherapy and video visit   I connected with Doroteo Glassman and/or Dayton Scrape Chasen's n/a via  Telephone or Video Enabled Telemedicine Application  (Video is Caregility application) and verified that I am speaking with the correct person using two identifiers. Discussed confidentiality: Yes   I discussed the limitations of telemedicine and the availability of in person appointments.  Discussed there is a possibility of technology failure and discussed alternative modes of communication if that failure occurs.  I discussed that engaging in this telemedicine visit, they consent to the provision of behavioral healthcare and the services will be billed under their insurance.  Patient and/or legal guardian expressed understanding and consented to Telemedicine visit: Yes   Presenting Concerns: Patient and/or family reports the following symptoms/concerns: depression Duration of problem: over one year; Severity of problem: mild  Patient and/or Family's Strengths/Protective Factors: Concrete supports in place (healthy food, safe environments, etc.)  Goals Addressed: Patient will:  Reduce symptoms of: depression   Increase knowledge and/or ability of: coping skills   Demonstrate ability to: Increase healthy adjustment to current life circumstances  Progress towards Goals: Ongoing  Interventions: Interventions utilized:  Supportive Counseling Standardized Assessments completed: Not Needed  Patient and/or Family Response: Tammy Green  reports depressive symptoms such as crying, negative thoughts patterns, fatigue and isolation  Assessment: Patient currently experiencing depression.   Patient may benefit from integrated behavioral health.  Plan: Follow up with behavioral health clinician on : 11/23/2022 Behavioral recommendations:  create and maintain boundaries, engage in self care and journal writing  Referral(s): Integrated [facility] (In Clinic)  I discussed the assessment and treatment plan with the patient and/or parent/guardian. They were provided an opportunity to ask questions and all were answered. They agreed with the plan and demonstrated an understanding of the instructions.   They were advised to call back or seek an in-person evaluation if the symptoms worsen or if the condition fails to improve as anticipated.  Gwyndolyn Saxon, LCSW

## 2022-11-23 ENCOUNTER — Encounter: Payer: Medicaid Other | Admitting: Licensed Clinical Social Worker

## 2022-11-30 ENCOUNTER — Encounter: Payer: Medicaid Other | Admitting: Licensed Clinical Social Worker

## 2023-02-24 ENCOUNTER — Emergency Department (HOSPITAL_COMMUNITY): Payer: Medicaid Other

## 2023-02-24 ENCOUNTER — Other Ambulatory Visit: Payer: Self-pay

## 2023-02-24 ENCOUNTER — Emergency Department (HOSPITAL_COMMUNITY)
Admission: EM | Admit: 2023-02-24 | Discharge: 2023-02-24 | Disposition: A | Discharge status: Home / Self Care | Payer: Medicaid Other | Attending: Emergency Medicine | Admitting: Emergency Medicine

## 2023-02-24 DIAGNOSIS — R1032 Left lower quadrant pain: Secondary | ICD-10-CM

## 2023-02-24 DIAGNOSIS — N3000 Acute cystitis without hematuria: Secondary | ICD-10-CM | POA: Insufficient documentation

## 2023-02-24 DIAGNOSIS — R1031 Right lower quadrant pain: Secondary | ICD-10-CM | POA: Diagnosis present

## 2023-02-24 LAB — COMPREHENSIVE METABOLIC PANEL
ALT: 15 U/L (ref 0–44)
AST: 16 U/L (ref 15–41)
Albumin: 3.8 g/dL (ref 3.5–5.0)
Alkaline Phosphatase: 55 U/L (ref 38–126)
Anion gap: 8 (ref 5–15)
BUN: 7 mg/dL (ref 6–20)
CO2: 22 mmol/L (ref 22–32)
Calcium: 8.6 mg/dL — ABNORMAL LOW (ref 8.9–10.3)
Chloride: 106 mmol/L (ref 98–111)
Creatinine, Ser: 0.74 mg/dL (ref 0.44–1.00)
GFR, Estimated: >60 mL/min (ref >60)
Glucose, Bld: 87 mg/dL (ref 70–99)
Potassium: 3.3 mmol/L — ABNORMAL LOW (ref 3.5–5.1)
Sodium: 136 mmol/L (ref 135–145)
Total Bilirubin: 0.6 mg/dL (ref 0.3–1.2)
Total Protein: 6.9 g/dL (ref 6.5–8.1)

## 2023-02-24 LAB — URINALYSIS, ROUTINE W REFLEX MICROSCOPIC
Bilirubin Urine: NEGATIVE
Glucose, UA: NEGATIVE mg/dL
Ketones, ur: NEGATIVE mg/dL
Nitrite: POSITIVE — AB
Protein, ur: NEGATIVE mg/dL
Specific Gravity, Urine: 1.028 (ref 1.005–1.030)
pH: 6 (ref 5.0–8.0)

## 2023-02-24 LAB — CBC
HCT: 41.7 % (ref 36.0–46.0)
Hemoglobin: 14 g/dL (ref 12.0–15.0)
MCH: 30.4 pg (ref 26.0–34.0)
MCHC: 33.6 g/dL (ref 30.0–36.0)
MCV: 90.5 fL (ref 80.0–100.0)
Platelets: 328 10*3/uL (ref 150–400)
RBC: 4.61 MIL/uL (ref 3.87–5.11)
RDW: 13.7 % (ref 11.5–15.5)
WBC: 10.2 10*3/uL (ref 4.0–10.5)
nRBC: 0 % (ref 0.0–0.2)

## 2023-02-24 LAB — I-STAT CG4 LACTIC ACID, ED: Lactic Acid, Venous: 1.3 mmol/L (ref 0.5–1.9)

## 2023-02-24 LAB — HCG, SERUM, QUALITATIVE: Preg, Serum: NEGATIVE

## 2023-02-24 LAB — ETHANOL: Alcohol, Ethyl (B): 83 mg/dL — ABNORMAL HIGH (ref <10)

## 2023-02-24 LAB — LIPASE, BLOOD: Lipase: 30 U/L (ref 11–51)

## 2023-02-24 MED ORDER — SODIUM CHLORIDE 0.9 % IV BOLUS
1000.0000 mL | Freq: Once | INTRAVENOUS | Status: AC
  Administered 2023-02-24: 1000 mL via INTRAVENOUS

## 2023-02-24 MED ORDER — ONDANSETRON 4 MG PO TBDP
4.0000 mg | ORAL_TABLET | Freq: Once | ORAL | Status: AC
  Administered 2023-02-24: 4 mg via ORAL
  Filled 2023-02-24: qty 1

## 2023-02-24 MED ORDER — KETOROLAC TROMETHAMINE 15 MG/ML IJ SOLN
15.0000 mg | Freq: Once | INTRAMUSCULAR | Status: AC
  Administered 2023-02-24: 15 mg via INTRAVENOUS
  Filled 2023-02-24: qty 1

## 2023-02-24 MED ORDER — CEPHALEXIN 500 MG PO CAPS: 500.0000 mg | ORAL_CAPSULE | Freq: Two times a day (BID) | ORAL | 0 refills | Status: DC

## 2023-02-24 MED ORDER — HYDROCODONE-ACETAMINOPHEN 5-325 MG PO TABS
1.0000 | ORAL_TABLET | Freq: Once | ORAL | Status: DC
Start: 2023-02-24 — End: 2023-02-24

## 2023-02-24 MED ORDER — SODIUM CHLORIDE 0.9 % IV SOLN
1.0000 g | Freq: Once | INTRAVENOUS | Status: AC
  Administered 2023-02-24: 1 g via INTRAVENOUS
  Filled 2023-02-24: qty 10

## 2023-02-24 MED ORDER — IOHEXOL 350 MG/ML SOLN
75.0000 mL | Freq: Once | INTRAVENOUS | Status: AC | PRN
  Administered 2023-02-24: 75 mL via INTRAVENOUS

## 2023-02-24 NOTE — ED Notes (Signed)
Patient transported to CT 

## 2023-02-24 NOTE — ED Provider Triage Note (Signed)
Emergency Medicine Provider Triage Evaluation Note  Tammy Green , a 47 y.o. female  was evaluated in triage.  Pt complains of left lower quadrant abdominal pain.  Patient reports getting off work earlier in the evening and having 1 shot of tequila.  Upon arriving at home she had sudden pain that began at 10:30 PM.  Patient also states she has nausea, vomiting, diarrhea.  Patient currently on menstrual cycle, states menstrual cycle is abnormal with dark brown blood.  Review of Systems  Positive:  Negative:   Physical Exam  BP 101/60 (BP Location: Right Arm)   Pulse (!) 103   Temp 97.9 F (36.6 C) (Oral)   Resp (!) 21   Ht 5\' 6"  (1.676 m)   Wt 83.9 kg   LMP 02/24/2023   SpO2 100%   BMI 29.86 kg/m  Gen:   Awake, no distress   Resp:  Normal effort  MSK:   Moves extremities without difficulty  Other:  LLQ TTP  Medical Decision Making  Medically screening exam initiated at 12:59 AM.  Appropriate orders placed.  Tammy Green was informed that the remainder of the evaluation will be completed by another provider, this initial triage assessment does not replace that evaluation, and the importance of remaining in the ED until their evaluation is complete.     Darrick Grinder, PA-C 02/24/23 1610

## 2023-02-24 NOTE — ED Notes (Signed)
To ultrasound

## 2023-02-24 NOTE — ED Provider Notes (Signed)
[place] EMERGENCY DEPARTMENT AT [facility] Provider Note   CSN: [id number] Arrival date & time: 02/24/23  5284     History  Chief Complaint  Patient presents with   Abdominal Pain    Tammy Green is a 47 y.o. female, no pertinent past medical history, presents to the ED secondary to left abdominal pain has been going on for the last few hours.  She states that she went to bed after taking 1 tequila shot, after getting off work, and was awakened by left upper and left lower abdominal pain.  She endorses 3 episodes of vomiting, but denies any diarrhea, fevers or chills.  She states the pain is sharp and hurts.  Does not radiate.  Has never had something like this before.  Is currently menstruating    Home Medications Prior to Admission medications   Medication Sig Start Date End Date Taking? Authorizing Provider  cephALEXin (KEFLEX) 500 MG capsule Take 1 capsule (500 mg total) by mouth 2 (two) times daily. 02/24/23  Yes Teka Chanda L, PA  COLLAGEN-CHOND-HYALURONIC ACID PO Take by mouth.    [provider]  Flaxseed, Linseed, (FLAX SEED OIL PO) Take by mouth. Patient not taking: Reported on 04/03/2021    [provider]  levothyroxine (SYNTHROID) 150 MCG tablet TAKE 1/2 (ONE-HALF) TABLET BY MOUTH ONCE DAILY BEFORE BREAKFAST 03/03/21   Passmore, Enid Derry I, NP  metroNIDAZOLE (FLAGYL) 500 MG tablet Take 1 tablet (500 mg total) by mouth 2 (two) times daily. 11/04/22   Carlynn Herald, CNM  Misc Natural Products (APPLE CIDER VINEGAR DIET PO) Take by mouth. Patient not taking: Reported on 04/03/2021    [provider]  Multiple Vitamins-Minerals (MULTIVITAMIN WITH MINERALS) tablet Take 1 tablet by mouth daily.    [provider]      Allergies    Nizatidine, Penicillin g, Sulfamethoxazole, and Codeine    Review of Systems   Review of Systems  Gastrointestinal:  Positive for abdominal pain.    Physical Exam Updated Vital Signs BP  102/76   Pulse 85   Temp 98.1 F (36.7 C) (Oral)   Resp 15   Ht 5\' 6"  (1.676 m)   Wt 83.9 kg   LMP 02/24/2023   SpO2 100%   BMI 29.86 kg/m  Physical Exam Vitals and nursing note reviewed.  Constitutional:      General: She is not in acute distress.    Appearance: She is well-developed.     Comments: Appears intoxicated  HENT:     Head: Normocephalic and atraumatic.  Eyes:     Conjunctiva/sclera: Conjunctivae normal.  Cardiovascular:     Rate and Rhythm: Normal rate and regular rhythm.     Heart sounds: No murmur heard. Pulmonary:     Effort: Pulmonary effort is normal. No respiratory distress.     Breath sounds: Normal breath sounds.  Abdominal:     Palpations: Abdomen is soft.     Tenderness: There is abdominal tenderness in the left upper quadrant and left lower quadrant. There is no guarding or rebound.  Musculoskeletal:        General: No swelling.     Cervical back: Neck supple.  Skin:    General: Skin is warm and dry.     Capillary Refill: Capillary refill takes less than 2 seconds.  Neurological:     Mental Status: She is alert.  Psychiatric:        Mood and Affect: Mood normal.  ED Results / Procedures / Treatments   Labs (all labs ordered are listed, but only abnormal results are displayed) Labs Reviewed  COMPREHENSIVE METABOLIC PANEL - Abnormal; Notable for the following components:      Result Value   Potassium 3.3 (*)    Calcium 8.6 (*)    All other components within normal limits  URINALYSIS, ROUTINE W REFLEX MICROSCOPIC - Abnormal; Notable for the following components:   APPearance HAZY (*)    Hgb urine dipstick LARGE (*)    Nitrite POSITIVE (*)    Leukocytes,Ua MODERATE (*)    Bacteria, UA FEW (*)    All other components within normal limits  ETHANOL - Abnormal; Notable for the following components:   Alcohol, Ethyl (B) 83 (*)    All other components within normal limits  LIPASE, BLOOD  CBC  HCG, SERUM, QUALITATIVE  I-STAT CG4 LACTIC  ACID, ED    EKG None  Radiology CT ABDOMEN PELVIS W CONTRAST  Result Date: 02/24/2023 CLINICAL DATA:  Left lower quadrant abdominal pain EXAM: CT ABDOMEN AND PELVIS WITH CONTRAST TECHNIQUE: Multidetector CT imaging of the abdomen and pelvis was performed using the standard protocol following bolus administration of intravenous contrast. RADIATION DOSE REDUCTION: This exam was performed according to the departmental dose-optimization program which includes automated exposure control, adjustment of the mA and/or kV according to patient size and/or use of iterative reconstruction technique. CONTRAST:  75mL OMNIPAQUE IOHEXOL 350 MG/ML SOLN COMPARISON:  02/02/2005 FINDINGS: Lower chest:  No contributory findings. Hepatobiliary: No focal liver abnormality.No evidence of biliary obstruction or stone. Pancreas: Unremarkable. Spleen: Unremarkable. Adrenals/Urinary Tract: Negative adrenals. No hydronephrosis or ureteral stone. Numerous left renal calculi numbering at least 6 and measuring up to 4 mm at the upper pole. 2 or 3 punctate right renal calculi best seen on coronal reformats. Unremarkable bladder. Stomach/Bowel:  No obstruction. No appendicitis. Vascular/Lymphatic: No acute vascular abnormality. No mass or adenopathy. Reproductive:No pathologic findings. Other: No ascites or pneumoperitoneum. Musculoskeletal: No acute abnormalities.  Scoliosis. IMPRESSION: 1. No acute finding. 2. Multiple, left more than right, renal calculi. Electronically Signed   By: Tiburcio Pea M.D.   On: 02/24/2023 04:55   CT Lumbar Spine Wo Contrast  Result Date: 02/24/2023 CLINICAL DATA:  Lower back pain after fall from bed EXAM: CT LUMBAR SPINE WITHOUT CONTRAST TECHNIQUE: Multidetector CT imaging of the lumbar spine was performed without intravenous contrast administration. Multiplanar CT image reconstructions were also generated. RADIATION DOSE REDUCTION: This exam was performed according to the departmental  dose-optimization program which includes automated exposure control, adjustment of the mA and/or kV according to patient size and/or use of iterative reconstruction technique. COMPARISON:  None Available. FINDINGS: Segmentation: 5 lumbar type vertebrae. Alignment: Normal. Vertebrae: No acute fracture or focal pathologic process. Paraspinal and other soft tissues: Negative. Disc levels: Little degenerative change and no evidence of high-grade stenosis. IMPRESSION: No evidence of injury to the lumbar spine. Electronically Signed   By: Tiburcio Pea M.D.   On: 02/24/2023 04:05    Procedures Procedures    Medications Ordered in ED Medications  ondansetron (ZOFRAN-ODT) disintegrating tablet 4 mg (4 mg Oral Given 02/24/23 0101)  ketorolac (TORADOL) 15 MG/ML injection 15 mg (15 mg Intravenous Given 02/24/23 0105)  sodium chloride 0.9 % bolus 1,000 mL (0 mLs Intravenous Stopped 02/24/23 0453)  iohexol (OMNIPAQUE) 350 MG/ML injection 75 mL (75 mLs Intravenous Contrast Given 02/24/23 0210)  sodium chloride 0.9 % bolus 1,000 mL (1,000 mLs Intravenous New Bag/Given 02/24/23 0452)  cefTRIAXone (  ROCEPHIN) 1 g in sodium chloride 0.9 % 100 mL IVPB (1 g Intravenous New Bag/Given 02/24/23 0527)    ED Course/ Medical Decision Making/ A&P                                 Medical Decision Making Patient is a 47 year old female, here for left lower abdominal pain that started earlier tonight.  Also states his left upper quadrant as well.  Vomited a few times.  States that she did take a shot, after getting off work.  Is currently menstruating.  Denies any urinary symptoms.  Will obtain a CT abdomen pelvis, given her discomfort, as well as blood work.  CT lumbar spine ordered given patient was dropped off the CT table, is now complaining of midline lumbar pain.  No other injuries, no trauma to the head, neck, no other pain  Amount and/or Complexity of Data Reviewed Labs: ordered.    Details: Moderate leukocytes,  in urine, few bacteria, nitrate positive Radiology: ordered.    Details: CT abdomen pelvis, shows stones in the left kidney, no other findings Discussion of management or test interpretation with external provider(s): Discussed with patient, she is still complaining of pain, offered ultrasound, to rule out torsion, he she agreed to, but then blatantly refused, when she was an ultrasound.  She voiced understanding of risk associated with this, and proceeded to declined.  Will go ahead and treat her for urinary tract infection, and give her Keflex 500 twice a day for 7 days.  Have her follow-up with her PCP.  We discussed return precautions and she voiced understanding.  Discharged home she does not have any obstructing stones, thus this is reassuring, given her urinary tract infection.  Additionally her blood pressure was slightly low here, but she was sleeping, and laying flat, which I think this is likely what is the cause of her low prior blood pressure, when she sat up, blood pressure improved  Risk Prescription drug management.    Final Clinical Impression(s) / ED Diagnoses Final diagnoses:  Left lower quadrant abdominal pain  Acute cystitis without hematuria    Rx / DC Orders ED Discharge Orders          Ordered    cephALEXin (KEFLEX) 500 MG capsule  2 times daily        02/24/23 0610              Rondo Spittler, Harley [place], PA 02/24/23 0615    Sabas Sous, MD 02/24/23 [id number]

## 2023-02-24 NOTE — ED Triage Notes (Signed)
Pt c/o sudden left lower abdominal pain that started this yesterday night around 10:30 pm. Per note pt sts she went out to have a drink after getting off of work. Had 1 shot of tequila. When she got home she began to feel sudden pain. Pain associated NVD. Currently on menstrual cycle, sts abnormal with dark brown blood.

## 2023-02-24 NOTE — Discharge Instructions (Addendum)
Please follow-up with your primary care doctor, your CAT scan was reassuring.  Although did show was some kidney stones.  I recommended you have an ultrasound, which she refused.  Return to the ER if you feel like your symptoms are worsening.

## 2023-12-15 ENCOUNTER — Ambulatory Visit: Admitting: Obstetrics

## 2024-04-10 ENCOUNTER — Telehealth: Payer: Self-pay

## 2024-05-02 ENCOUNTER — Ambulatory Visit: Payer: Self-pay | Admitting: Obstetrics and Gynecology

## 2024-05-02 ENCOUNTER — Encounter: Payer: Self-pay | Admitting: Obstetrics and Gynecology

## 2024-05-02 VITALS — BP 109/77 | HR 86 | Ht 66.0 in | Wt 211.0 lb

## 2024-05-02 DIAGNOSIS — R8290 Unspecified abnormal findings in urine: Secondary | ICD-10-CM | POA: Diagnosis not present

## 2024-05-02 DIAGNOSIS — Z1231 Encounter for screening mammogram for malignant neoplasm of breast: Secondary | ICD-10-CM

## 2024-05-02 DIAGNOSIS — Z01419 Encounter for gynecological examination (general) (routine) without abnormal findings: Secondary | ICD-10-CM

## 2024-05-02 LAB — POCT URINALYSIS DIPSTICK
Bilirubin, UA: NEGATIVE
Glucose, UA: NEGATIVE
Ketones, UA: NEGATIVE
Leukocytes, UA: NEGATIVE
Nitrite, UA: NEGATIVE
Protein, UA: POSITIVE — AB
Spec Grav, UA: 1.015
Urobilinogen, UA: NEGATIVE U/dL — AB
pH, UA: 7.5

## 2024-05-02 NOTE — Progress Notes (Signed)
 "   GYNECOLOGY ANNUAL PREVENTATIVE CARE ENCOUNTER NOTE  History:     Tammy Green is a 49 y.o. [phone] female here for a routine annual gynecologic exam.  Current complaints: abnormal urine odor.   Denies abnormal vaginal bleeding, discharge, pelvic pain, problems with intercourse or other gynecologic concerns.    Gynecologic History Patient's last menstrual period was 04/04/2024 (approximate). Contraception: none Last Pap: 7/24. Results were: normal with negative HPV Last mammogram: 3/23. Results were: normal  Obstetric History OB History  Gravida Para Term Preterm AB Living  12 3 2 1 9 3   SAB IAB Ectopic Multiple Live Births  7 2   3     # Outcome Date GA Lbr Len/2nd Weight Sex Type Anes PTL Lv  12 Preterm 05/27/12 [redacted]w[redacted]d / 01:10 6 lb 5.9 oz (2.89 kg) F Vag-Spont EPI  LIV  11 IAB           10 SAB           9 SAB           8 SAB           7 Term     M Vag-Spont   LIV  6 SAB           5 SAB           4 SAB           3 Term     M Vag-Spont   LIV  2 SAB           1 IAB             Past Medical History:  Diagnosis Date   Anxiety    Depression    Irregular menstrual bleeding 06/2019   Kidney stones 2013   Prediabetes 06/2019   Thyroid  disease     Past Surgical History:  Procedure Laterality Date   FEMUR FRACTURE SURGERY     WISDOM TOOTH EXTRACTION      Medications Ordered Prior to Encounter[1]  Allergies[2]  Social History:  reports that she quit smoking about 5 years ago. Her smoking use included cigarettes. She started smoking about 23 years ago. She has a 4.5 pack-year smoking history. She quit smokeless tobacco use about 12 years ago. She reports current alcohol use. She reports that she does not use drugs.  Family History  Problem Relation Age of Onset   Bipolar disorder Mother    Bipolar disorder Sister    Diabetes Maternal Grandmother    Cancer Maternal Grandfather    Bipolar disorder Brother    Breast cancer Neg Hx     The following portions of  the patient's history were reviewed and updated as appropriate: allergies, current medications, past family history, past medical history, past social history, past surgical history and problem list.  Review of Systems Pertinent items noted in HPI and remainder of comprehensive ROS otherwise negative.  Physical Exam:  BP 109/77   Pulse 86   Ht 5' 6 (1.676 m)   Wt 211 lb (95.7 kg)   LMP 04/04/2024 (Approximate)   BMI 34.06 kg/m  CONSTITUTIONAL: Well-developed, well-nourished female in no acute distress.  HENT:  Normocephalic, atraumatic, External right and left ear normal. Oropharynx is clear and moist EYES: Conjunctivae and EOM are normal.  NECK: Normal range of motion, supple, no masses.  Normal thyroid .  SKIN: Skin is warm and dry. No rash noted. Not diaphoretic. No erythema. No pallor. MUSCULOSKELETAL: Normal range of motion. No  tenderness.  No cyanosis, clubbing, or edema.  2+ distal pulses. NEUROLOGIC: Alert and oriented to person, place, and time. Normal reflexes, muscle tone coordination.  PSYCHIATRIC: Normal mood and affect. Normal behavior. Normal judgment and thought content. CARDIOVASCULAR: Normal heart rate noted, regular rhythm RESPIRATORY: Clear to auscultation bilaterally. Effort and breath sounds normal, no problems with respiration noted. BREASTS:deferred ABDOMEN: Soft, no distention noted.  No tenderness, rebound or guarding.  PELVIC: Normal appearing external genitalia and urethral meatus; normal appearing vaginal mucosa and cervix.  No abnormal discharge noted.  Pap smear obtained.  Normal uterine size, no other palpable masses, no uterine or adnexal tenderness.  Performed in the presence of a chaperone.   Assessment and Plan:    1. Foul smelling urine (Primary) UA essentially normal Urine culture pending If continue issues possibly consider urology consult  - POCT Urinalysis Dipstick - Urine Culture  2. Women's annual routine gynecological examination Normal  annual exam  3. Screening mammogram for breast cancer  - MM 3D SCREENING MAMMOGRAM BILATERAL BREAST; Future   Mammogram scheduled Routine preventative health maintenance measures emphasized. Please refer to After Visit Summary for other counseling recommendations.      Jerilynn Buddle, MD, FACOG Obstetrician & Gynecologist, [facility], [facility]      [1]  Current Outpatient Medications on File Prior to Visit  Medication Sig Dispense Refill   COLLAGEN-CHOND-HYALURONIC ACID PO Take by mouth.     Flaxseed, Linseed, (FLAX SEED OIL PO) Take by mouth.     levothyroxine  (SYNTHROID ) 150 MCG tablet TAKE 1/2 (ONE-HALF) TABLET BY MOUTH ONCE DAILY BEFORE BREAKFAST 21 tablet 0   Multiple Vitamins-Minerals (MULTIVITAMIN WITH MINERALS) tablet Take 1 tablet by mouth daily.     phentermine (ADIPEX-P) 37.5 MG tablet Take 37.5 mg by mouth daily.     gabapentin (NEURONTIN) 300 MG capsule Take 300 mg by mouth at bedtime.     Current Facility-Administered Medications on File Prior to Visit  Medication Dose Route Frequency Provider Last Rate Last Admin   lidocaine  HCl (PF) (XYLOCAINE ) 2 % injection 4 mL  4 mL Infiltration Once [place], Tewana I, NP      [2]  Allergies Allergen Reactions   Nizatidine Hives   Penicillin  G Hives   Sulfamethoxazole  Other (See Comments), Hives and Rash    fever fever fever   Codeine Hives    Pt states she can take Percocet but can not take Vicodin   "

## 2024-05-04 LAB — URINE CULTURE

## 2024-05-05 ENCOUNTER — Ambulatory Visit: Payer: Self-pay | Admitting: Obstetrics and Gynecology
